# Patient Record
Sex: Female | Born: 1980 | Race: White | Hispanic: No | Marital: Married | Smoking: Never smoker
Health system: Southern US, Community
[De-identification: ages and names within clinical notes are randomized; demographics above are authoritative.]

## PROBLEM LIST (undated history)

## (undated) DIAGNOSIS — Z5189 Encounter for other specified aftercare: Secondary | ICD-10-CM

## (undated) DIAGNOSIS — IMO0001 Reserved for inherently not codable concepts without codable children: Secondary | ICD-10-CM

## (undated) DIAGNOSIS — K219 Gastro-esophageal reflux disease without esophagitis: Secondary | ICD-10-CM

## (undated) DIAGNOSIS — M797 Fibromyalgia: Secondary | ICD-10-CM

## (undated) DIAGNOSIS — F419 Anxiety disorder, unspecified: Secondary | ICD-10-CM

## (undated) DIAGNOSIS — F32A Depression, unspecified: Secondary | ICD-10-CM

## (undated) DIAGNOSIS — F329 Major depressive disorder, single episode, unspecified: Secondary | ICD-10-CM

## (undated) DIAGNOSIS — E039 Hypothyroidism, unspecified: Secondary | ICD-10-CM

## (undated) DIAGNOSIS — D649 Anemia, unspecified: Secondary | ICD-10-CM

## (undated) DIAGNOSIS — R51 Headache: Secondary | ICD-10-CM

## (undated) DIAGNOSIS — E282 Polycystic ovarian syndrome: Secondary | ICD-10-CM

## (undated) HISTORY — PX: ADENOIDECTOMY: SHX5191

---
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
## 1999-03-28 ENCOUNTER — Other Ambulatory Visit: Admission: RE | Admit: 1999-03-28 | Discharge: 1999-03-28 | Payer: Self-pay | Admitting: Obstetrics and Gynecology

## 1999-11-22 ENCOUNTER — Encounter: Payer: Self-pay | Admitting: Pediatrics

## 1999-11-22 ENCOUNTER — Inpatient Hospital Stay (HOSPITAL_COMMUNITY): Admission: AD | Admit: 1999-11-22 | Discharge: 1999-11-26 | Payer: Self-pay | Admitting: Pediatrics

## 1999-12-13 ENCOUNTER — Emergency Department (HOSPITAL_COMMUNITY): Admission: EM | Admit: 1999-12-13 | Discharge: 1999-12-13 | Payer: Self-pay | Admitting: Emergency Medicine

## 1999-12-17 ENCOUNTER — Inpatient Hospital Stay (HOSPITAL_COMMUNITY): Admission: EM | Admit: 1999-12-17 | Discharge: 1999-12-20 | Payer: Self-pay | Admitting: Emergency Medicine

## 1999-12-19 ENCOUNTER — Encounter: Payer: Self-pay | Admitting: Pediatrics

## 2000-01-24 ENCOUNTER — Emergency Department (HOSPITAL_COMMUNITY): Admission: EM | Admit: 2000-01-24 | Discharge: 2000-01-24 | Payer: Self-pay | Admitting: Emergency Medicine

## 2000-09-23 ENCOUNTER — Other Ambulatory Visit: Admission: RE | Admit: 2000-09-23 | Discharge: 2000-09-23 | Payer: Self-pay | Admitting: Obstetrics and Gynecology

## 2000-10-09 ENCOUNTER — Encounter: Payer: Self-pay | Admitting: Emergency Medicine

## 2000-10-09 ENCOUNTER — Emergency Department (HOSPITAL_COMMUNITY): Admission: EM | Admit: 2000-10-09 | Discharge: 2000-10-09 | Payer: Self-pay | Admitting: Emergency Medicine

## 2001-07-11 ENCOUNTER — Emergency Department (HOSPITAL_COMMUNITY): Admission: EM | Admit: 2001-07-11 | Discharge: 2001-07-11 | Payer: Self-pay | Admitting: Emergency Medicine

## 2001-07-11 ENCOUNTER — Encounter: Payer: Self-pay | Admitting: Emergency Medicine

## 2001-12-05 ENCOUNTER — Encounter: Payer: Self-pay | Admitting: Emergency Medicine

## 2001-12-05 ENCOUNTER — Emergency Department (HOSPITAL_COMMUNITY): Admission: EM | Admit: 2001-12-05 | Discharge: 2001-12-06 | Payer: Self-pay | Admitting: Emergency Medicine

## 2003-01-07 ENCOUNTER — Emergency Department (HOSPITAL_COMMUNITY): Admission: EM | Admit: 2003-01-07 | Discharge: 2003-01-07 | Payer: Self-pay | Admitting: Emergency Medicine

## 2004-05-04 ENCOUNTER — Ambulatory Visit (HOSPITAL_COMMUNITY): Admission: RE | Admit: 2004-05-04 | Discharge: 2004-05-04 | Payer: Self-pay | Admitting: General Surgery

## 2004-05-09 ENCOUNTER — Ambulatory Visit (HOSPITAL_COMMUNITY): Admission: RE | Admit: 2004-05-09 | Discharge: 2004-05-09 | Payer: Self-pay | Admitting: General Surgery

## 2004-05-22 ENCOUNTER — Encounter: Admission: RE | Admit: 2004-05-22 | Discharge: 2004-08-20 | Payer: Self-pay | Admitting: General Surgery

## 2004-07-31 ENCOUNTER — Inpatient Hospital Stay (HOSPITAL_COMMUNITY): Admission: RE | Admit: 2004-07-31 | Discharge: 2004-08-02 | Payer: Self-pay | Admitting: General Surgery

## 2004-09-18 ENCOUNTER — Encounter: Admission: RE | Admit: 2004-09-18 | Discharge: 2004-09-18 | Payer: Self-pay | Admitting: General Surgery

## 2006-11-22 ENCOUNTER — Ambulatory Visit: Payer: Self-pay | Admitting: Internal Medicine

## 2006-11-22 LAB — CONVERTED CEMR LAB: Sed Rate: 21 mm/hr (ref 0–25)

## 2006-11-30 ENCOUNTER — Ambulatory Visit: Payer: Self-pay | Admitting: Internal Medicine

## 2007-01-30 ENCOUNTER — Ambulatory Visit: Payer: Self-pay | Admitting: Internal Medicine

## 2007-02-18 ENCOUNTER — Ambulatory Visit: Payer: Self-pay | Admitting: Internal Medicine

## 2007-02-22 ENCOUNTER — Emergency Department (HOSPITAL_COMMUNITY): Admission: EM | Admit: 2007-02-22 | Discharge: 2007-02-22 | Payer: Self-pay | Admitting: Emergency Medicine

## 2007-02-27 ENCOUNTER — Ambulatory Visit: Payer: Self-pay | Admitting: Internal Medicine

## 2007-02-27 ENCOUNTER — Ambulatory Visit (HOSPITAL_COMMUNITY): Admission: RE | Admit: 2007-02-27 | Discharge: 2007-02-27 | Payer: Self-pay | Admitting: Internal Medicine

## 2007-03-13 ENCOUNTER — Ambulatory Visit: Payer: Self-pay | Admitting: Internal Medicine

## 2007-03-19 ENCOUNTER — Emergency Department (HOSPITAL_COMMUNITY): Admission: EM | Admit: 2007-03-19 | Discharge: 2007-03-20 | Payer: Self-pay | Admitting: Emergency Medicine

## 2007-04-22 ENCOUNTER — Ambulatory Visit: Payer: Self-pay | Admitting: Internal Medicine

## 2007-04-22 ENCOUNTER — Ambulatory Visit (HOSPITAL_COMMUNITY): Admission: RE | Admit: 2007-04-22 | Discharge: 2007-04-22 | Payer: Self-pay | Admitting: Internal Medicine

## 2007-10-19 ENCOUNTER — Inpatient Hospital Stay (HOSPITAL_COMMUNITY): Admission: EM | Admit: 2007-10-19 | Discharge: 2007-10-22 | Payer: Self-pay | Admitting: Emergency Medicine

## 2007-10-19 ENCOUNTER — Ambulatory Visit: Payer: Self-pay | Admitting: Internal Medicine

## 2007-11-22 ENCOUNTER — Emergency Department (HOSPITAL_COMMUNITY): Admission: EM | Admit: 2007-11-22 | Discharge: 2007-11-23 | Payer: Self-pay | Admitting: Emergency Medicine

## 2007-12-09 ENCOUNTER — Inpatient Hospital Stay (HOSPITAL_COMMUNITY): Admission: EM | Admit: 2007-12-09 | Discharge: 2007-12-10 | Payer: Self-pay | Admitting: Emergency Medicine

## 2008-05-07 ENCOUNTER — Emergency Department (HOSPITAL_BASED_OUTPATIENT_CLINIC_OR_DEPARTMENT_OTHER): Admission: EM | Admit: 2008-05-07 | Discharge: 2008-05-07 | Payer: Self-pay | Admitting: Emergency Medicine

## 2008-05-07 ENCOUNTER — Emergency Department (HOSPITAL_COMMUNITY): Admission: EM | Admit: 2008-05-07 | Discharge: 2008-05-07 | Payer: Self-pay | Admitting: Emergency Medicine

## 2009-03-05 ENCOUNTER — Emergency Department (HOSPITAL_BASED_OUTPATIENT_CLINIC_OR_DEPARTMENT_OTHER): Admission: EM | Admit: 2009-03-05 | Discharge: 2009-03-05 | Payer: Self-pay | Admitting: Emergency Medicine

## 2009-03-05 ENCOUNTER — Ambulatory Visit: Payer: Self-pay | Admitting: Diagnostic Radiology

## 2009-09-27 ENCOUNTER — Encounter: Admission: RE | Admit: 2009-09-27 | Discharge: 2009-09-27 | Payer: Self-pay | Admitting: Obstetrics & Gynecology

## 2011-02-06 LAB — BASIC METABOLIC PANEL
Chloride: 104 mEq/L (ref 96–112)
Creatinine, Ser: 0.7 mg/dL (ref 0.4–1.2)
Sodium: 144 mEq/L (ref 135–145)

## 2011-02-06 LAB — PREGNANCY, URINE

## 2011-03-13 NOTE — Discharge Summary (Signed)
NAMEKSENIYA, GRUNDEN             ACCOUNT NO.:  [id number]   MEDICAL RECORD NO.:  [id number]          PATIENT TYPE:  INP   LOCATION:  1508                         FACILITY:  [facility]   PHYSICIAN:  Mobolaji B. Bakare, M.D.DATE OF BIRTH:  06/05/1981   DATE OF ADMISSION:  10/19/2007  DATE OF DISCHARGE:                               DISCHARGE SUMMARY   PRIMARY CARE PHYSICIAN:  Brandt Loosen, M.D. at [facility].   CARDIOLOGIST:  Doylene Canning. Ladona Ridgel, M.D.   FINAL DIAGNOSES:  1. Delirium secondary to hyperthyroidism.  2. 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  3. History of hypothyroidism, on Synthroid prior to hospitalization.  4. Sinus tachycardia during this hospitalization.  The patient has a      history of inappropriate tachycardia.  5. Bipolar disorder.  6. Anxiety disorder.   PROCEDURES:  MRI of the brain done on October 22, 2007 showed no acute  abnormality.  It was essentially normal.  Head CT scan done on October 19, 2007 showed no active intracranial abnormality. Chest x-ray done on  October 19, 2007 showed no acute cardiopulmonary disease.   CONSULTATIONS:  Psychiatric consult provided by Dr. Jeanie Sewer.   BRIEF HISTORY:  Ms. [name] is a 30 year old Caucasian female who  presented with acute onset of headache and visual of hallucinations.  There was no fever or chills.  She had no symptoms to suggest infection.  Her chest x-ray was normal.  Urinalysis was normal.  She does have a  history of bipolar disorder.  She gives a history of recent increase in  her medications, including alprazolam and Synthroid.  The patient was  admitted with visual hallucinations and headache for further evaluation.   Delirium. There was no cause of infection to explain the delirium  picture as mention. The patient was noted to have further symptoms  including sweaty palms, tachycardia and palpitations.  TSH, free T4 and  T3 were checked.  These were clearly abnormal with a TSH of  0.206, free  T4 of 1.81 (normal range of 0.89 to 1.80) and a T3 of 204 (normal range  80 to 204).  Synthroid was temporarily held.  She was seen in  consultation by Dr. Jeanie Sewer, who concurred that the delirium is  secondary to hyperthyroidism.  She was started on Zyprexa and her  symptoms resolved.  Additionally, Synthroid has been held temporarily  until she sees her primary care physician who will restart it.  Ultimately, she needs a step-down of her Synthroid dose.   Acute bronchitis.  The patient has a cough productive of sputum.  There  was no fever or leukocytosis.  She had cultures x1, which were no  growth.  Rapid strep  was negative.  She was treated symtomatically.   Sinus tachycardia.  Heart rate were in the range of  100 through 147,  Coreg was continued on admission. Coreg was increased at the time of  discharge for better heart rate control . She should follow up with her  primary care physician and Dr. Lewayne Bunting in 1-2 weeks.  It is noted  that the patient has history of  sinus tachycardia .   Bipolar disorder.  This was stable during the current hospitalization.   DISCHARGE MEDICATIONS:  1. Coreg 12.5 mg B.I.D.  2. Zyprexa 2.5 mg every evening.  3. Ativan 2 mg t.i.d.  4. __________ p.r.n.  5. Levothyroxine is on hold.   DISCHARGE CONDITION:  Stable.   FOLLOWUP:  Follow up with Dr. Lewayne Bunting. Marland Kitchen   DISCHARGE VITALS:  Temperature 98.1, pulse 114, respiratory rate 18,  blood pressure 115/85, O2 saturations of 96%      Mobolaji B. Corky Downs, M.D.  Electronically Signed     MBB/MEDQ  D:  10/22/2007  T:  10/22/2007  Job:  [id number]   cc:   Doylene Canning. Ladona Ridgel, MD  [address]  [address]  [place]  [place] [address]   Brandt Loosen, M.D.  [place]

## 2011-03-13 NOTE — H&P (Signed)
[name], [name]             ACCOUNT NO.:  [id number]   MEDICAL RECORD NO.:  [id number]          PATIENT TYPE:  INP   LOCATION:  0102                         FACILITY:  [facility]   PHYSICIAN:  Darryl D. Prime, MD    DATE OF BIRTH:  [birth year]   DATE OF ADMISSION:  12/09/2007  DATE OF DISCHARGE:                              HISTORY & PHYSICAL   PRIMARY CARE PHYSICIAN:  The patient was unable to give current history  due to heavy sedation and she was the only one present in the room.   CHIEF COMPLAINT:  She could not give due to being heavily sedated,  somnolent.   TOTAL VISIT TIME:  Approximately 30 minutes.   HISTORY OF PRESENT ILLNESS:  [name] is a 30 year old female  with a history apparently of bipolar disorder, a brain tumor,  hypothyroidism, who took 49 of her 2-mm p.o. Ativan sometime tonight, at  least.  The patient is unsure of the exact details.  The patient in the  emergency room was given IV fluids and her vital signs were checked,  which were stable and have been stable.  She is significantly drowsy and  I am unable to arouse.   PAST MEDICAL HISTORY/PAST SURGICAL HISTORY:  History as above.  Also a  history of status post cholecystectomy, history of possible  hypertension.   MEDICATIONS:  1. Synthroid 175 mcg daily.  2. Carvedilol 6.25 mg twice a day.  3. Ativan 2 mg p.o. t.i.d.   ALLERGIES:  She is allergic to BETADINE.   PHYSICAL EXAM:  Blood pressure 117/74, pulse of 90, respiratory rate of  16, temperature 97.6.  Saturations are 98% on room air.  GENERAL:  Her pupils equal, round, and reactive to light.  She is  heavily sedated and responds only to heavy noxious stimuli.  She has her  eyes closed .She is pale, chronically ill-appearing, obese.  The oropharynx appears dry.  NECK:  Supple with no lymphadenopathy or thyromegaly.  LUNGS:  Clear to auscultation bilaterally.  CARDIOVASCULAR:  Regular rate and rhythm with no murmurs, rubs or  gallops.  Normal S1, S2.  No S3 or S4.  ABDOMEN:  Obese, soft, nontender, nondistended, with no  hepatosplenomegaly.  EXTREMITIES:  There is no clubbing or cyanosis.  There is trace lower  extremity edema.  Foley is draining clear urine.   The patient's EKG showed normal sinus rhythm with a rate of 87, normal  axis, normal intervals, with a QT corrected at 459.  Otherwise negative.   LABORATORY DATA:  Sodium of 140, potassium 3.4, chloride 106, bicarb 29,  BUN 5, creatinine 0.74, glucose of 101.  Alcohol less than 5.  Acetaminophen less than 10.  UDS positive for benzodiazepines and  opiates.  White blood cell count is 8.6, hemoglobin 12.2, hematocrit 36,  platelets 404, with segs of 64%.  Her salicylates is less than 4.  Chest  x-ray is pending.   ASSESSMENT AND PLAN:  This is a patient with a history of bipolar  disorder.  I am unsure of the details of her possible overdose, which  she  is significantly sedated.  She will be admitted to the ICU under  close observation for her vital signs and neurologic status.  When she  does wake up, I suggest she may need a psychiatry consult to rule out  suicidal attempt.  We will check a TSH and prophylaxis will be with  enoxaparin and proton pump inhibitors for DVT prophylaxis and GI  prophylaxis.      Darryl D. Prime, MD  Electronically Signed     DDP/MEDQ  D:  12/09/2007  T:  12/09/2007  Job:  [id number]

## 2011-03-13 NOTE — Consult Note (Signed)
[name], [name]             ACCOUNT NO.:  [id number]   MEDICAL RECORD NO.:  [id number]          PATIENT TYPE:  INP   LOCATION:  1508                         FACILITY:  [facility]   PHYSICIAN:  Antonietta Breach, M.D.  DATE OF BIRTH:  1981-10-15   DATE OF CONSULTATION:  10/19/2007  DATE OF DISCHARGE:  10/22/2007                                 CONSULTATION   REASON FOR CONSULTATION:  Psychosis.   HISTORY:  [name] is a 30 year old female admitted to the [facility] on December 21 with visual hallucinations.  The patient  developed approximately one week of progressive visual hallucinations  along with a headache. She has been having confusion.   PAST PSYCHIATRIC HISTORY:  The patient has, in the medical record,  listed question of bipolar disorder, and also listed a history of  anxiety and depression.   MENTAL STATUS EXAM:  As above.  The patient continues with the delirium  symptoms.   ASSESSMENT:  293.00, delirium, not otherwise specified.  This may be  secondary to hyperthyroidism.   I concur with antipsychotic medication, Zyprexa 5 mg daily.      Antonietta Breach, M.D.  Electronically Signed     JW/MEDQ  D:  03/10/2008  T:  03/10/2008  Job:  [id number]

## 2011-03-13 NOTE — H&P (Signed)
[name], [name]             ACCOUNT NO.:  [id number]   MEDICAL RECORD NO.:  [id number]          PATIENT TYPE:  INP   LOCATION:  1401                         FACILITY:  [facility]   PHYSICIAN:  Gardiner Barefoot, MD    DATE OF BIRTH:  November 11, 1980   DATE OF ADMISSION:  10/19/2007  DATE OF DISCHARGE:                              HISTORY & PHYSICAL   PRIMARY CARE PHYSICIAN:  Gentry Fitz, is in [place].   CHIEF COMPLAINT:  Headache and visual hallucinations.   HISTORY OF PRESENT ILLNESS:  This is a 30 year old female with multiple  medical problems, including history of migraines with aura; gastric  bypass, anxiety and a report of bipolar disorder, hypothyroidism.  Presents here with 2 weeks of progressively worsening headache,  described as frontal and going around to her occipital region;  predominately felt to be due to increased stress.  She reports she has  also had some right-sided migraines that are typical and continue to be  unrelieved by traditional migraine medications.  Of note, she does  report that she takes Vicodin and rest, which does alleviate her  migraines.  She reports that she has recently increased her thyroid  medications, as well as changed from alprazolam to lorazepam with  different doses (from 0.5 mg of alprazolam 3 times a day to 2 mg and  Ativan 3 times a day).  She also has had 2 increases in her  levothyroxine from 137 to 150 to 175 over the period of the last 2  months.  She also continues to see her cardiologist here at [facility], and  endocrinology for her thyroid disease.  The patient otherwise reports no  recent fevers.   PAST MEDICAL HISTORY:  1. History of gastric bypass.  2. Hypothyroidism.  3. Anxiety.  4. Depression.  5. Question of bipolar disorder.  6. Polycystic ovarian disease, as listed.  7. Appendectomy.  8. Cholecystectomy.  9. Tonsillectomy.  10.Inappropriate tachycardia.   MEDICATIONS:  1. Ativan 2 mg p.o. t.i.d.  2.  Levothyroxine 175 mcg p.o. daily.  3. Vitamin B12.  4. Calcium chloride.  5. Multivitamin.  6. Coreg.   ALLERGIES:  PENICILLIN, TOPAMAX, SHELLFISH and SHRIMP.   SOCIAL HISTORY:  Denies any alcohol, tobacco or drugs.   FAMILY HISTORY:  Includes lupus in her mother and vasculitis in her  father.   REVIEW OF SYSTEMS:  Negative, except as per the history of present  illness.   PHYSICAL EXAMINATION:  Temperature 98.2, pulse 128, respirations 20, BP  111/75, O2 saturations 98% on room air.  GENERAL:  The patient is awake,  alert and oriented x3; appears in no acute distress.  CARDIOVASCULAR:  Tachycardic and regular rhythm; with no murmurs, rubs or gallops.  LUNGS:  Clear to auscultation bilaterally.  ABDOMEN:  Obese, nontender, nondistended.  Positive bowel sounds and no  hepatosplenomegaly.  EXTREMITIES:  No cyanosis or edema.   LABORATORY DATA:  EKG was sinus tachycardic.  ASR 16, AST 16, ALT 12,  sodium 139, potassium 3.7, chloride 101, bicarb 27, BUN 11, creatinine  1.20, glucose 114.  Alcohol less than 5.  WBC  7.5, hemoglobin 13,  platelets 388.  Urine pregnancy negative.   IMPRESSION AND PLAN:  1. Visual Hallucinations.  This may be medication-induced effect,      either increasing her benzodiazepines or changes to her thyroid      medications.  Will hold dose of her Ativan and change to a b.i.d.      regimen; as well as check her thyroid studies with a TSH and Free      T4.  If these not revealing, or if she continues to have visual      hallucinations will look for other etiology.  Also, will do an MRI      at this time.  She has had an abnormal MRI finding in the past and      has not been followed up since.  2. Anxiety.  As above, will hold her Ativan one dose and decrease      after that.  3. Hypothyroidism.  Will hold her dose at this time and wait for the      thyroid studies then restart as appropriate.  4. History of gastric bypass.  Will continue the patient's  vitamins.      Gardiner Barefoot, MD  Electronically Signed     RWC/MEDQ  D:  10/19/2007  T:  10/20/2007  Job:  [id number]

## 2011-03-13 NOTE — Assessment & Plan Note (Signed)
[facility]                         ELECTROPHYSIOLOGY OFFICE NOTE   [name], [name]                    MRN:          [id number]  DATE:03/13/2007                            DOB:          1981-06-26    [name] returns today for followup.  She is a very pleasant 30-year-  old young lady with a history of tachy palpitations and documented heart  rates in the 120-140 range.  She underwent head up tilt-table testing  where she had no inducible syncope.  The patient ultimately underwent EP  study and there was no inducible SVT.  No accessory pathway conduction,  no dual AV nodal physiology.  She was placed on Toprol XL and returns  today for followup.  Her history is notable in that she is status post  stomach surgery to help with weight loss.  She denies any medical  noncompliance.  She knows that she gets fatigued very easily and is  tired all the time.  She does admit to having a very stressful life  style and works up to 14 hours a day.  She is on her feet all the time.  She has dyspnea on exertion.  She has preserved LV function by 2D echo.  She has no history of lung problems.   MEDICATIONS:  1. Toprol XL 100 daily.  2. Synthroid.  3. Multivitamin.  4. Ovcon.   PHYSICAL EXAMINATION:  GENERAL:  She is a pleasant, well-appearing young  woman in no distress.  VITAL SIGNS:  Blood pressure was 108/62, the pulse was 115, and regular,  respirations were 18, the weight was 180 pounds.  NECK:  Revealed no jugular venous distension.  LUNGS:  Clear bilaterally to auscultation and no wheezes, rales or  rhonchi and I did not appreciate any difficulty with breathing.  CARDIOVASCULAR EXAM:  Revealed a regular tachycardia with normal S1 and  S2.  EXTREMITIES:  Demonstrated no edema.   EKG demonstrates sinus tachycardia.   IMPRESSION:  1. Inappropriate sinus tachycardia.  2. Fatigue and dyspnea.  3. History of obesity with gastric bypass  surgery.   DISCUSSION:  [name] symptoms today are interesting in that her  heart rate is still high despite fairly high dose Toprol.  With her  prior history of stomach surgery, I wonder if in fact she is not  absorbing her beta blocker secondary to the coated portion of the beta  blocker and slow release form of it. With regard to her dyspnea, I had  her walk on pulse oximetry three steps one time today.  Each time her  heart rate would start in the 115-120 range and would go up to a maximum  of 140 beats per minute.  During exercise, particularly at the end of  exercise, her oxygen saturation would drop from the low 90's into the  low 80's and would be sustained in the low 80's.  I checked the pulse  oximetry monitor and it appeared to be working satisfactorily.  The  patient noted fatigue and weakness with this.   IMPRESSION:  1. Inappropriate sinus tachycardia.  2. Symptomatic fatigue and  dyspnea.  3. Palpitations secondary to #1.   With the above findings I have asked the patient to first and foremost  change her Toprol to Metoprolol 50 twice a day.  I have also asked that  she undergo cardiopulmonary stress-testing so we might better understand  why her oxygen saturation is dropping with exertion. There is no history  of occult lung disease.  Additional recommendations will be based on the  results of her cardiopulmonary stress-test.     Doylene Canning. Ladona Ridgel, MD  Electronically Signed    GWT/MedQ  DD: 03/13/2007  DT: 03/13/2007  Job #: [id number]   cc:   Bevelyn Buckles. Bensimhon, MD

## 2011-03-13 NOTE — Op Note (Signed)
NAMEPHEBE, DETTMER             ACCOUNT NO.:  [id number]   MEDICAL RECORD NO.:  [id number]          PATIENT TYPE:  OIB   LOCATION:  2899                         FACILITY:  [facility]   PHYSICIAN:  Doylene Canning. Ladona Ridgel, MD    DATE OF BIRTH:  1980-11-29   DATE OF PROCEDURE:  02/27/2007  DATE OF DISCHARGE:                               OPERATIVE REPORT   PROCEDURE PERFORMED:  Head-up tilt table testing using isoproterenol  followed by electrophysiologic testing also using isoproterenol.   INTRODUCTION:  The patient is a 30 year old woman with a history of  tachy palpitations for many months.  The patient has been very active  and had several jobs.  We have tried her on medical therapy with beta  blockers, but these have not helped in any appreciable way to control  her palpitations.  She is now referred for head-up tilt table testing as  she has had a history of syncope and the question is whether or not she  may have postural orthostatic tachycardia syndrome or whether or not she  has evidence of neurally mediated syncope.  Prior cardiac monitoring  suggested a very rapid sinus tachycardia through an atrial tachycardia  or sinus node reentry could not be excluded.   PROCEDURE:  After informed consent was obtained, the patient was taken  to the diagnostic EP lab in a fasting state.  After the usual  preparation, she was placed in the supine position and underwent head-up  tilt table testing.  Her initial blood pressure was 140/90 and her heart  rate was 90.  She was placed in the 70 degree head up tilt table  position and her heart rate which was initially in the low 90s increased  over the next 30 minutes up to 122 beats per minute.  It increased  further up to a high of 130 beats per minute.  During this time her  blood pressure remained fairly stable in the 120 to 130 range.  She felt  hot, but she did not pass out.  She was placed back in the supine  position and isoproterenol was  started 7.5 mics per minute and then  increased up to 15 mics per minute wherein her heart rate increased to  115 beats per minute supine with a blood pressure in the 130s.  She was  turned back up into the 70 degree head up tilt table position and her  heart rate increased from the mid 130s up to 180 beats per minute.  Her  blood pressure during this time did not change.  She did not have any  frank syncope.  She subsequently now will be referred for invasive  electrophysiologic study.   After the patient was prepped and draped in the usual manner, she was  given additional fentanyl and Versed for sedation.  It should be noted  that very large amounts of sedation were given without much effect on  the patient.  Specifically she was given in less than one hours time  over 30 mg of Valium, 10 mg of Versed and approximately 150 mics of  fentanyl and  had no significant sedative effects on her.  A 6-French  hexapolar catheter was inserted percutaneously into the right jugular  vein and advanced to the coronary sinus.  A 5-French quadripolar  catheter was inserted percutaneously in the right femoral vein and  advanced to the RV apex.  A 5-French quadripolar catheter was inserted  percutaneously in the right femoral vein and advanced to the His bundle  region.  After measurement of the basic intervals, rapid ventricular  pacing was carried out from the RV apex and stepwise decreased down 270  milliseconds where VA conduction remained intact.  During rapid  ventricular pacing, the atrial activation was midline and decremental.  Next, programmed ventricular stimulation was carried out from the RV  apex at a base drive cycle length of 540 milliseconds.  The S1, S2  interval was stepwise decreased down to 210 milliseconds where the  retrograde AV node ERP was observed.  During programmed ventricular  stimulation, the atrial activation was again midline and decremental.  Next, programmed atrial  stimulation was carried out from the coronary  sinus at a base drive cycle length of 981 milliseconds.  The S1, S2  interval was stepwise decreased down to 210 milliseconds where the AV  node ERP was observed.  During programmed atrial stimulation, there no  AH jumps and no echo beats and no inducible SVT.  Next, rapid atrial  pacing was carried out from the coronary sinus in the high right atrium  with paced cycle lengths of 500 milliseconds and stepwise decreased down  to 240 milliseconds where AV Wenckebach was observed.  During rapid  atrial pacing, the PR interval was equal to but not greater than the RR  interval and there was no inducible SVT.  At this point isoproterenol  was infused at rates of up to 4 mics per minute.  On 1 mic of  isoproterenol the patient had a resting heart rate of 130 beats per  minute.  Additional rapid atrial pacing as well as programmed atrial  stimulation was carried out utilizing S1, S2 and S1, S2, S3 stimuli, but  despite this very aggressive protocol of pacing and stimulation, there  was no inducible SVT.  At this point the catheters were removed.  Hemostasis was assured, and the patient was returned to her room in  satisfactory condition.   COMPLICATIONS:  There were no procedure complications.   RESULTS:  A.  Baseline electrocardiogram.  The baseline ECG demonstrates  sinus rhythm with normal axis and intervals.  B.  Baseline intervals.  The HV interval was 39 milliseconds.  The QRS  duration was 94 milliseconds.  C.  Rapid ventricular pacing.  Rapid ventricular pacing was carried out  from the RV apex demonstrating VA Wenckebach less than or equal to 270  milliseconds.  During rapid ventricular pacing, the atrial activation  was midline and decremental.  D.  Programmed ventricular stimulation.  Programmed ventricular  stimulation was carried out from the RV apex at a base drive cycle length of 191 milliseconds.  The S1, S2 interval was stepwise  decreased  down to 210 milliseconds where the retrograde AV node ERP was observed.  During programmed ventricular stimulation, the atrial activation was  again midline and decremental.  E.  Rapid atrial pacing.  Rapid atrial pacing was carried out from the  coronary sinus in the high right atrium and stepwise decreased down to  240 milliseconds where AV Wenckebach was observed.  On isoproterenol,  right atrial pacing was carried  out down to 210 milliseconds where AV  Wenckebach was observed.  F.  Programmed atrial stimulation.  Programmed atrial stimulation was  carried out from the high right atrium to the coronary sinus at base  drive cycle length of 161 milliseconds.  The S1, S2 interval was  stepwise decreased down to 210 milliseconds where AV node ERP was  observed.  During programmed atrial stimulation, there no AH jumps and  no echo beats.   CONCLUSION:  This study demonstrates first a head-up tilt table test  which demonstrated no evidence of inducible syncope.  There was a very  marked and dramatic rise in the patient's heart rate on fairly low doses  of isoproterenol raising the question of an exquisitely sensitive sinus  node.  The EP study demonstrates no inducible VT or SVT and no evidence  of any accessory pathway conduction, any evidence of a slow pathway or  any evidence of inducible atrial tachycardia.  In summary, it appears  that the patient has inappropriate sinus tachycardia with a very rapidly  conduction AV node.      Doylene Canning. Ladona Ridgel, MD  Electronically Signed     GWT/MEDQ  D:  02/27/2007  T:  02/27/2007  Job:  [id number]   cc:   Dr. Brandt Loosen

## 2011-03-13 NOTE — Discharge Summary (Signed)
[name], [name]             ACCOUNT NO.:  [id number]   MEDICAL RECORD NO.:  [id number]          PATIENT TYPE:  OIB   LOCATION:  2899                         FACILITY:  [facility]   PHYSICIAN:  Carolanne Grumbling, M.D.    DATE OF BIRTH:  1981-03-29   DATE OF ADMISSION:  02/27/2007  DATE OF DISCHARGE:  02/27/2007                               DISCHARGE SUMMARY   PRINCIPAL DIAGNOSES:  1. History of tachy palpitations and dizzy spells for many months      prior to this admission.  2. Tilt table study, Feb 27, 2007, no syncope and no inducible syncope      with Isuprel.  3. Electrophysiology study, Feb 27, 2007, no inducible ventricular      tachycardia/supraventricular tachycardia.  No accessory pathway was      aluminated.  No AV nodal reentry tachycardia.  No atrial      tachycardia.  Her AV conduction is very fast noted in the EP lab.  4. Both studies were done to elucidate: Symptomatic      (lightheaded/presyncopal) tachy palpitations.      a.     Monitor showing ST versus AT.      b.     Patient on beta-blocker therapy, which does not abate her       tachy palpitations effectively.   SECONDARY DIAGNOSES:  1. Bipolar disorder.  2. History of migraines.  3. Status post bariatric surgery.   Patient discharged after the above studies.  She is having some pain in  the right neck and the right groin where the catheters were placed.   1. She will be treated with Vicodin 5/500 one to two tabs every 4-6      hours as needed for pain for this.  2. She also has treated hypothyroidism.  She is on levothyroxine 125      mcg daily.  3. Multivitamin daily.  4. She is to continue Toprol-XL 100 mg daily.   Patient to also follow up with Dr. Ladona Ridgel, Monday, May 19 at 11:30 and  she will be given a prescription Fioricet migraine control.   Laboratory studies pertinent to this admission were taken May 1, white  cell 7.6, hemoglobin 13.4, hematocrit 39.9, platelets 356.  Protime  12.7, INR 0.9.   Sodium is 139, potassium 3.7, chloride 107, carbonate  25, glucose 86, BUN is 5, creatinine 0.76.      Maple Mirza, [place]      Carolanne Grumbling, M.D.  Electronically Signed    GM/MEDQ  D:  02/27/2007  T:  02/27/2007  Job:  [id number]   cc:   Bevelyn Buckles. Bensimhon, MD

## 2011-03-13 NOTE — Consult Note (Signed)
[name]             ACCOUNT NO.:  [id number]   MEDICAL RECORD NO.:  [id number]          PATIENT TYPE:  INP   LOCATION:  1521                         FACILITY:  [facility]   PHYSICIAN:  Antonietta Breach, M.D.  DATE OF BIRTH:  12/27/1980   DATE OF CONSULTATION:  12/10/2007  DATE OF DISCHARGE:                                 CONSULTATION   REFERRING PHYSICIAN:  [facility].   REASON FOR CONSULTATION:  Overdose.   HISTORY OF PRESENT ILLNESS:  [name] is a 30 year old female  admitted to [facility] after an overdose on Ativan.  There may  have been some other substances as well.   The patient had just filled her Ativan bottle which has been prescribed  by her primary care physician.  She is not followed by a psychiatrist.  On obtaining the history, the fiance participated in the session with eh  patient's request and permission.  He confirmed that the patient did  drink beer prior to taking Ativan. At some point, she had taken the  Ativan with the beer and then no longer could remember anything until  she woke up in the [facility]. She denies drinking more than  1/2 of beer; however, she clearly states that she does not recall any  suicidal thoughts, nor does she recall any intention to harm herself.  She states that her mood, interests, constructive future goals have all  been intact.  She does not have any thoughts of harming herself or  others.  She has no hallucinations or delusions.   She has been receiving Ativan from her primary care physician for  excessive worry, feeling on edge, and insomnia as well as muscle  tension.   The patient is now oriented to all spheres.  Her memory function is now  intact.  She is socially appropriate and cooperative. She has  constructive future goals.  She denies any illegal drugs.  Initially she  denied alcohol and then acknowledged very minimal use.  She does not  give a history of dependence.   PAST  PSYCHIATRIC HISTORY:  There is no prior history of self harm or  suicide attempts.  She has no history of mania.  She has never been on  psychotropic medications other than Ativan prescribed by her primary  care physician.   She has never been seen by a psychiatrist nor has she had any  psychiatric admissions.  She has had counseling for coping skills and  stress management.   FAMILY PSYCHIATRIC HISTORY:  None known.   SOCIAL HISTORY:  The patient is living with her parents which she states  does result in increased anxiety.  She has a loving supportive fiance  who wants to help her in her health care.   PAST MEDICAL HISTORY:  Status post benzodiazepine and alcohol overdose.   MEDICATIONS:  Reviewed.   REVIEW OF SYSTEMS:  Noncontributory.   MENTAL STATUS EXAM:  [name] is alert.  She is oriented to all  spheres.  Her attention span is within normal limits.  Concentration  within normal limits.  Mood within  normal limits.  Affect broad and  appropriate.  She is oriented to all spheres.  Her memory function is  now intact and intact to immediate, recent, and remote except for the  overdose blackout period.  Fund of knowledge and intelligence within  normal limits.  Speech within normal limits.  Though process: Logical,  coherent, goal directed.  No looseness of associations.  Thought  content:  No thoughts of harming herself, no thoughts of harming others,  no delusions, no hallucinations.  Insight is partial.  Judgment is  intact.   The patient is motivated for alcohol and drug services followup.   ASSESSMENT:  AXIS I:  1. Adjustment disorder with mixed disturbance of emotions and conduct,      provisional.  The patient does have a possible history of      generalized anxiety disorder.  2. Rule out polysubstance dependence.  AXIS II:  Deferred.  AXIS III:  See general medical section in the record.  AXIS IV:  Primary support group.  AXIS V:  55.   [name] is no  longer at risk to harm herself or others.  She does  agree to call emergency services immediately for any thoughts of harming  herself, thoughts of harming others, or distress.   Given that the patient did overdose on substances resulting in general  medical admission, the undersigned did recommend admission to an  inpatient dual diagnosis program; however, the patient declined, and she  is not committable now that she has recovered from intoxication.   She is motivated for outpatient treatment.   RECOMMENDATIONS:  1. Would ask the social worker to set this patient up with alcohol and      drug services for counseling as well as her chemical abuse      problems.  2. Regarding anxiety, her anxiety condition is not sever enough to      warrant using psychotropic medication under the circumstances of      substance abuse.  Will defer the possible use of an SSRI, for      example, to the patient's outpatient treatment team.  He type of      anxiety seems to be most amenable to therapy as the #1 first line      treatment approach including cognitive behavioral therapy, deep      breathing, and progressive muscle relaxation.      Antonietta Breach, M.D.  Electronically Signed     JW/MEDQ  D:  12/10/2007  T:  12/10/2007  Job:  [id number]   cc:   [facility]

## 2011-03-13 NOTE — Discharge Summary (Signed)
[name], [name]             ACCOUNT NO.:  [id number]   MEDICAL RECORD NO.:  [id number]          PATIENT TYPE:  OIB   LOCATION:  2899                         FACILITY:  [facility]   PHYSICIAN:  Maple Mirza, PA   DATE OF BIRTH:  1981/05/11   DATE OF ADMISSION:  02/27/2007  DATE OF DISCHARGE:                               DISCHARGE SUMMARY   ADDENDUM:  1. Previous dictation greater than 25 minutes, a discharge dictation.  2. The patient was hampered by migraine headaches.  The first thought      was to prescribe Fioricet.  This has caffeine, and this probably      would exacerbate her tachy palpitations, and so prescription for      Fioricet was withdrawn.      Maple Mirza, PA     GM/MEDQ  D:  02/27/2007  T:  02/27/2007  Job:  [id number]

## 2011-03-13 NOTE — Consult Note (Signed)
[name], [name]             ACCOUNT NO.:  [id number]   MEDICAL RECORD NO.:  [id number]          PATIENT TYPE:  EMS   LOCATION:  MAJO                         FACILITY:  [facility]   PHYSICIAN:  Garrison Columbus. Haithcock, MDDATE OF BIRTH:  [birth year]   DATE OF CONSULTATION:  DATE OF DISCHARGE:  03/20/2007                                 CONSULTATION   HISTORY OF PRESENT ILLNESS:  Ms. [name] presented to the emergency  room because of tachy palpitations.  She has been under the care of Dr.  Lewayne Bunting for the same and has been diagnosed with inappropriate  sinus tachycardia.  She feels that her symptoms have progressed despite  beta blocker therapy.  Besides the progression of her symptoms, they  have not changed in nature since the original conversations with Dr.  Ladona Ridgel during which inappropriate sinus tach had been diagnosed.  The  patient has undergone a tilt-table test as well as a complete workup,  which included thyroid function evaluation - the patient is hypothyroid  and is under the care of a physician for this.  The patient requested a  different medication beside metoprolol today; her mother suggested  Sotalol.  We decided upon Diltiazem.  I wrote the patient a prescription  for Diltiazem 30 mg p.o. q.6 hours and asked that she call Dr. Ladona Ridgel in  the morning to confirm his agreement with this medication change.      Daniel B. Haithcock, MD  Electronically Signed     DBH/MEDQ  D:  03/20/2007  T:  03/20/2007  Job:  [id number]

## 2011-03-16 NOTE — Discharge Summary (Signed)
[name], [name]             ACCOUNT NO.:  [id number]   MEDICAL RECORD NO.:  [id number]          PATIENT TYPE:  INP   LOCATION:  1521                         FACILITY:  [facility]   PHYSICIAN:  Wilson Singer, M.D.DATE OF BIRTH:  [birth year]   DATE OF ADMISSION:  12/09/2007  DATE OF DISCHARGE:  12/10/2007                               DISCHARGE SUMMARY   FINAL DISCHARGE DIAGNOSIS:  Benzodiazepine overdose, not actively  suicidal   CONDITION ON DISCHARGE:  Stable.   HISTORY:  This is a 30 year old lady who was admitted with a  benzodiazepine overdose and has a history of bipolar disorder.  Please  see initial history and physical examination done by Dr. Hannah Beat.   HOSPITAL PROGRESS:  The patient was admitted and was medically stable  fairly quickly in the next day or so.  She was reviewed by Dr.  Jeanie Sewer, psychiatrist, who felt that she had an adjustment disorder  with mixed disturbance of emotions and conduct.  She did not feel that  she needed inpatient therapy for any suicidal ideation.  The patient in  any case declined this that was offered in terms of admission to an  inpatient unit for polysubstance abuse as well.  The patient was able to  be discharged in stable condition on no medication and has agreed to  follow up with a primary care physician.      Wilson Singer, M.D.  Electronically Signed     NCG/MEDQ  D:  02/20/2008  T:  02/20/2008  Job:  [id number]

## 2011-03-16 NOTE — Letter (Signed)
November 22, 2006    Brandt Loosen, M.D.  [address]  [place], [place] [address]   RE:  [name], [name]  MRN:  [id number]  /  DOB:  01-30-1981   Dear Dr. Wende Bushy:   Thank you for referring Ms. [name] for EP evaluation.  As you  know, she is a pleasant 30 year old woman with a history of obesity,  status post bariatric surgery, which has recently developed tachy  palpitations with heart rates of up to 140 beats per minute which occur  without exertion.  She is referred today for evaluation.   PHYSICAL EXAMINATION:  Her exam is for the most part unremarkable except  that she is overweight, but without any obvious complaints.  VITAL SIGNS:  All stable.  The rest of her exam was unremarkable.   I have discussed the treatment options with the patient and her mother,  who is also a patient of yours, and who I have seen as well.  I have  recommended that we have her wear a cardiac monitor and undergo a period  of watchful waiting.  Depending on the results of the monitor, we may  recommend additional medical therapy for this patient.  Once again,  thanks for referring Ms. Barzdins for EP evaluation.    Sincerely,      Doylene Canning. Ladona Ridgel, MD  Electronically Signed   GWT/MedQ  DD: 11/22/2006  DT: 11/23/2006  Job #: [id number]

## 2011-03-16 NOTE — Assessment & Plan Note (Signed)
[facility]                         ELECTROPHYSIOLOGY OFFICE NOTE   [name], [name]                    MRN:          [id number]  DATE:11/22/2006                            DOB:          April 14, 1981    [name] is referred today by Dr. Brandt Loosen from [facility] for  evaluation of palpitations and an irregular heart beat.   HISTORY OF PRESENT ILLNESS:  The patient is a very pleasant 30 year old  woman with morbid obesity who is status post bariatric surgery  approximately 2 years ago. She also carries a diagnosis of bipolar  disorder and has had palpitations off and on for many years. 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 She is status post tonsillectomy and  cholecystectomy in the past as well. She is status post appendectomy.  The patient notes that her palpitations occur suddenly and typically  without warning. She notes that her heart sometimes beats fast, but  usually it is more of a hard sensation or sensation of very hard  irregular heart beats. With this, she becomes diaphoretic and sometimes  dizzy and has chest pressure. Occasionally, she has shortness of breath.  The patient is referred today for additional evaluation. These episodes  may last for as long as 10 to 30 minutes. They start and stop gradually.  There is relationship to exertion. She has never had syncope. Her  additional past medical history is as noted in the HPI. Otherwise, she  has been stable. Her mother is concerned that she may have lupus and has  requested that we obtain sed rate and an ANA.   FAMILY HISTORY:  Notable for a father with vasculitis and a mother with  fibromyalgia and atrial fibrillation.   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 She denies alcohol or tobacco use. She presently  lives with her mother.   MEDICATIONS:   Ovcon.   SHE GIVES HISTORY OF ALLERGIES TO TOPAMAX AND ZOMIG AND PENICILLIN.   REVIEW OF SYSTEMS:  Is as noted in the HPI. She also has some  dysmenorrhea and endometriosis. She has problems with anxiety. She notes  some chronic anemia. She has some difficulty with food intake and gets  full quite easily now that she has undergone bariatric surgery.   FAMILY HISTORY:  As noted above. Father apparently is deceased from his  vasculitis.   The patient is noted to have p.r.n. propranolol available to her.   Her physical exam is notable for a pleasant, well appearing woman in no  acute distress. Blood pressure was 120/74 with a pulse 81 and regular.  The respirations were 18. The weight was 174 pounds.  HEENT:  Was normocephalic, atraumatic. Pupils were equal and round.  Oropharynx was moist. The sclerae were anicteric.  NECK:  Revealed no jugular venous distention. There was no thyromegaly.  Trachea was midline. Carotids were 2+ and symmetric.  The lungs were clear bilaterally to auscultation. No wheezes, rales or  rhonchi. There was no  increased work of breathing.  The cardiovascular exam was distant with regular rate and rhythm with  normal S1 and S2. No murmurs, rubs, or gallops.  ABDOMINAL EXAM:  Was obese, nontender, nondistended. There was no  organomegaly. The bowel sounds were present. There was no rebound or  guarding.  EXTREMITIES:  Demonstrated no clubbing, cyanosis, or edema. The pulses  were 2+ and symmetric.  The neurological exam was alert and oriented x3 with cranial nerves  intact. Strength was 5/5 and symmetric.   EKG demonstrates sinus rhythm with normal axis and intervals.   IMPRESSION:  1. Palpitations.  2. History of morbid obesity status post bariatric surgery.  3. History of migraine headaches.   DISCUSSION:  The etiology of the patient's symptoms was unclear.  Basically, there are three mechanisms. One would be inappropriate sinus  tachycardia with an  overactive sinus node cycle length. She could have  episodes of atrial tachycardia like her mother had, or thirdly she could  have SVT. I have discussed these with the patient and her mother in  detail. I have recommended a period of watchful waiting. She can  continue on her beta blocker. If her palpitations worsen or recur, then  having her wear a 30-day cardiac monitor would be warranted.     Doylene Canning. Ladona Ridgel, MD  Electronically Signed    GWT/MedQ  DD: 11/22/2006  DT: 11/23/2006  Job #: [id number]   cc:   Brandt Loosen, M.D.

## 2011-03-16 NOTE — Assessment & Plan Note (Signed)
[facility]                                 ON-CALL NOTE   ALIXANDREA, MILLESON                      MRN:          [id number]  DATE:12/14/2006                            DOB:          Mar 12, 1981    Description of call:  I got a call from the [employer] monitoring people  regarding Ms. Barzdinis.  She is a 30 year old patient of Dr. Lubertha Basque  with a history of palpitations.  She reported feeling weak and dizzy and  passing out tonight with loss of consciousness for two minutes while  lifting objects.  CardioNet monitor showed sinus tachycardia with a rate  of 115 up to 140.  There are no significant dysrhythmias.  The patient  was contacted by the [employer] services.  She says she feels much better  and is not interested in seeking further evaluation at this point.     Bevelyn Buckles. Bensimhon, MD  Electronically Signed    DRB/MedQ  DD: 12/14/2006  DT: 12/14/2006  Job #: [id number]

## 2011-03-16 NOTE — Discharge Summary (Signed)
[place]. [facility]  Patient:    [name], [name]                    MRN: [id number] Adm. Date:  [id number] Disc. Date: [id number] Attending:  Lesly Dukes                           Discharge Summary  FINAL DIAGNOSIS: 1.  Status migrainous with migraine without aura. 2.  Chronic migraine headaches. 3.  Morbid obesity, 278.01. 4.  Depression. 5.  Left anterior temporal lobe lesion, etiology unclear.  PROCEDURES: 1.  MRI of the brain. 2.  Treatment with dihydroergotamine intravenously. 3.  EEG.  COMPLICATIONS:  None.  HOSPITAL COURSE:  The patient was admitted to the hospital on an urgent basis because of intractable headaches.  As part of her work-up MRI scan of the brain  showed a left anterior temporal structure that had some evidence of cystic component but to closer inspection was actually a demyelinated region more consistent with a hamartoma, although a low-grade glioma could not be ruled out.  The lesion had no mass effect edema and no other abnormalities were seen.  EEG was performed and was normal.  The patient had initial hypertension which declined to normotensive state after she had relief from her headaches with repeated injections of dihydroergotamine 45 1 ml preceded by 10 mg of Reglan, also treated initially with Decadron 10 mg.  The patient continued for nine courses.  She was discharged with plans to place her on Migranal for rescue therapy.  There was a great deal of concern by the parents and the patient with respect to the nature of the lesion in the left anterior temporal lobe.  I assured the family that this was not responsible for her headaches but it did require further follow-up.  WBC was 10,400, hemoglobin 12.2, hematocrit 37.8, MCV 74.3; platelet count 430,000; 63% polys, 30% lymphs, 4% monos, 2% basophils, 1% large unidentified cells. Sedimentation rate 19.  Sodium 140, potassium 3.6, chloride 107, CO2  29, glucose 105, BUN 7, creatinine 1.0.  Calcium 9.1, total protein 7.0, albumin 3.3, AST 25, ALT 20, ALP 104, total bilirubin 0.3.  TSH 2.71.  Compliment C3 161, C4 25. CH50 118.  All of these are normal.  Anti-DNA negative.  Anti-ANA negative.  DISCHARGE CONDITION:  The patient was discharged in improved condition.  DISCHARGE MEDICATIONS: 1.  Atenolol. 2.  Migranal. 3.  Ultram.  DISPOSITION:  She will be followed up in the office.  Referral to a tertiary care center for second opinion concerning the left anterior temporal lobe lesion will be considered. DD:  12/30/99 TD:  01/01/00 Job: [id number]

## 2011-03-16 NOTE — Op Note (Signed)
NAMECRISTY, [name]             ACCOUNT NO.:  [id number]   MEDICAL RECORD NO.:  [id number]          PATIENT TYPE:  INP   LOCATION:  0473                         [facility]AC[facility]   PHYSICIAN:  Thornton Park. Daphine Deutscher, M.D.DATE OF BIRTH:  11-Jul-1981   DATE OF PROCEDURE:  07/31/2004  DATE OF DISCHARGE:                                 OPERATIVE REPORT   PROCEDURE:  Endoscopy.   This is a 30 year old morbidly obese female undergoing laparoscopic bypass.   DESCRIPTION OF PROCEDURE:  The upper endoscope was placed with minimal  difficulty but under direct vision to the upper digestive tract down to the  esophagus.  The Z line was felt to be around 40 cm with about a 4 cm long  pouch.  Insufflation of the pouch enabled the pouch to be distended and  submersed, and no evidence of bubbles were noted intra-abdominally.  There  is no evidence of bleeding within the pouch, and a patent anastomosis was  visualized and photographed.  The foregut was then decompressed, and the  scope withdrawn.      MBM/MEDQ  D:  07/31/2004  T:  07/31/2004  Job:  [id number]

## 2011-03-16 NOTE — H&P (Signed)
[place]. [facility]  Patient:    [name]                     MRN: [id number] Adm. Date:  [id number] Attending:  Mick Sell                         History and Physical  HISTORY OF PRESENT ILLNESS:  Ms. [name] is a 30 year old female, GI, PO, who presents at [redacted] weeks gestation [facility] 11/28/99), complaining of leakage of fluid.  The patient had been followed at [facility].  OBJECTIVE:  VITAL SIGNS:  Temperature 98.7, pulse 104, respirations 20, blood pressure 116/67.  PELVIC:  Uterine contractions are mild and are every three to seven minutes apart. The contraction stress test is negative.  The cervix is fingertip, 25% effaced nd -3 station.  There is no pooling.  Ferning is negative.  Nitrazine is negative.  ASSESSMENT:  Uterine contractions, but no rupture of membranes and no labor.  PLAN:  The patient will be discharged to home.  She will return if her contractions intensify.  She will return to the office in one week for examination if necessary. DD:  11/22/99 TD:  11/22/99 Job: [id number]

## 2011-03-16 NOTE — Discharge Summary (Signed)
[place]  Patient:    [name], [name]                    MRN: [id number] Adm. Date:  [id number] Disc. Date: [id number] Attending:  Lesly Dukes                           Discharge Summary  DATE OF BIRTH:  Jun 10, 1981  FINAL DIAGNOSIS: 1.  Migraine without aura, status migrainous, 346.11. 2.  Transformed headache disorder. 3.  Morbid obesity. 4.  Acne. 5.  Left anterior temporal hamartoma.  PROCEDURES: 1.  Cerebral angiogram, three vessel.  COMPLICATIONS:  None.  HOSPITAL COURSE:  The patient is a 30 year old woman who was admitted to [facility] on November 22, 1999 with status migrainous.  She was treated and responded to a DHE protocol.  Migranal at home, however, has not helped.  She has had increasing photophobia, nausea, vomiting, and occipital headaches with minimal stiffness.  She was admitted for further observation and treatment.  The patient was evaluated with cerebral arteriogram.  In part this was done to evaluate vasculitis and in part because of prior history of granulomatous angiitis in her father.  The arteriogram was normal.  The patient has been on DHE protocol.  This is day #3 of the protocol and she looks better.  She wishes to go home.  At present, we do not have a prophylactic medication that has been effective. e will increase Atenolol to 50 mg b.i.d., continue her Ultram for rescue.  Other medications that she currently takes are Sumycin 250 mg a day for acne, and oral contraceptives to regulate her periods.  This may be a factor in her headaches ut the headaches antedated use of the medication.  ACTIVITY:  The patient is to go to school as her headaches permit.  DIET:  She is to consume a low-fat/low-calorie diet for weight loss.  DISPOSITION:  I will be working to obtain consultation with the [facility].  If headaches persist we will need to perform a lumbar puncture to  look for pseudotumor cerebri and for CSF inflammation.  The family had wished this done under fluoroscopy.  I can feel the patients vertebral processes and believe that by sitting her up we can be successful with a lumbar puncture, and would perform this myself.  The patient does not wish to have a lumbar puncture now as her headaches are better and she wishes to go home.  DISCHARGE PHYSICAL EXAMINATION:  VITAL SIGNS:  Her assessment today shows a blood pressure of 110/58 resting, pulse 76, respirations 20, temperature 98.4 degrees.  HEENT:  Head and neck examination shows no localized tenderness.  Her headaches are two to three on a scale of ten at this time.  LUNGS:  Clear.  HEART:  No murmurs.  Pulses normal.  EXTREMITIES:  Well formed.  NEUROLOGIC:  Cranial nerves intact.  Motor, normal strength.  Gait and station ot tested.  DISCHARGE CONDITION:  Improved.  Plans are as noted above. DD:  12/20/99 TD:  12/20/99 Job: [id number]

## 2011-03-16 NOTE — Op Note (Signed)
NAMEMAEGHAN, CANNY             ACCOUNT NO.:  [id number]   MEDICAL RECORD NO.:  [id number]          PATIENT TYPE:  INP   LOCATION:  0473                         FACILITY:  [facility]   PHYSICIAN:  Sharlet Salina T. Hoxworth, M.D.DATE OF BIRTH:  05-27-1981   DATE OF PROCEDURE:  07/31/2004  DATE OF DISCHARGE:                                 OPERATIVE REPORT   PREOPERATIVE DIAGNOSIS:  Morbid obesity.   POSTOPERATIVE DIAGNOSIS:  Morbid obesity.   SURGICAL PROCEDURE:  Laparoscopic Roux-en-Y gastric bypass.   SURGEON:  [name], M.D.   ASSISTANT:  Thornton Park. Daphine Deutscher, M.D.   ANESTHESIA:  General.   HISTORY:  Cason Dabney is a 30 year old white female with an essentially  life-long history of progressive morbid obesity.  She has been through  multiple attempts at medically supervised and unsupervised weight loss  programs over the years and would lose a significant amount of weight but  quickly regained the weight and gain additional weight.  She now presents at  300 pounds with a BMI of 51.2.  She has worsening asthma, shortness of  breath, and joint pain.  After an extensive discussion of options, we have  elected to proceed with laparoscopic Roux-en-Y gastric bypass for surgical  correction of her obesity.  The nature of the procedure and risks were  discussed extensively and detailed elsewhere.  Following a thorough workup  detailed elsewhere, she is brought to the operating room for this procedure.   DESCRIPTION OF OPERATION:  The patient was brought to the operating room and  placed in the supine position on the operating table and general  endotracheal anesthesia was induced.  She had undergone a mechanical  antibiotic bowel prep.  She was given preoperative antibiotics.  Lovenox 40  mg was given subcutaneously.  PFs were placed.  The abdomen was widely  sterilely prepped and draped.  Local anesthesia was induced to infiltrate  the trocar sites prior to the incisions.   Through a 1-cm incision in the  left subcostal space, the abdomen was accessed and pneumoperitoneum was  established without difficulty with the Optiview trocar.  Under direct  vision, a 12-mm trocar was placed through the falciform ligament in the  right upper quadrant, another in the right mid abdomen, another in the left  mid abdomen, and a 5-mm in the left flank.  The omentum and transverse colon  were elevated and the ligament of Treitz identified.  A 40-cm afferent limb  was measured and then we went down about an additional 5-10 cm to get a more  mobile portion of the mesentery, which was quite mobile up toward the edge  of the liver.  At this point, the small intestine was divided with a single  firing of the Endo GIA 45-mm wide load stapler.  The mesentery was further  divided down to the first arcade with the harmonic scalpel for mobility.  The ends of the valve were healthy and well perfused.  A Penrose drain was  sutured to the end of the efferent limb for identification.  Following this,  a 100-cm efferent limb was carefully measured.  At this point, a side-to-  side jejunojejunostomy was created between the afferent and efferent limb  with harmonic scalpel enterotomies and a single firing of the Endo GIA 45-mm  wide load stapler.  The common enterotomy was then closed with running 2-0  Vicryl on either end of the enterotomy and tied centrally.  The mesenteric  defect behind the afferent limb was then closed with a running 2-0 silk  carried up onto the jejunojejunostomy.  Suture and staple lines of the  jejunojejunostomy were then coated with Tisseel tissue sealant.  The patient  was then placed in reverse Trendelenburg.  Through a 5-mm site in the  epigastrium, the Nathanson retractor was placed and the left lobe of the  liver elevated with good visualization of the hiatus and stomach.  Initially  the peritoneum over the angle of His was incised and the angle of His  mobilized  down along the left crux.  Following this, a point along the  lesser curve was identified for division about 4 cm from the  gastroesophageal junction.  The peritoneum adjacent to the stomach and  dissection was carried along the gastric wall posteriorly at right angles.  This was carried back for several centimeters and then with all tubes  removed an initial firing of the Endo GIA blue load stapler was performed at  right angles to the lesser curve.  Further dissection behind the pouch then  entered the lesser sac freely.  Several more staple firings were then used  to create a small pouch directing the staple line up toward the angle of  His.  The final firing was along the left crux with the Ewald tube through  the EG junction and the pouch was seen to be completely divided from the  gastric remnant.  The staple line along the gastric remnant was then  oversewn with running 2-0 Vicryl locking suture for hemostasis.  The  efferent limb was brought up to the pouch for anastomosis without any  detention in the antecolic position.  An initial posterior row of running 2-  0 Vicryl was placed between the efferent limb and the staple line along the  gastric pouch.  Ewald tube was then passed down to the tip of the gastric  pouch and enterotomies created in the stomach and jejunal limb with the  harmonic scalpel.  Anastomosis was then performed with the 45-mm blue load  linear stapler creating an approximately 2.5 cm anastomosis.  The staple  line was inspected and was intact without bleeding.  Common enterotomy was  then closed from either end with running 2-0 Vicryl.  The Ewald tube was  then passed down through the anastomosis into the efferent limb and an outer  layer of running 2-0 Vicryl seromuscular suture was placed.  Following this,  the Ewald tube was withdrawn.  With the efferent limb clamped near the gastrojejunostomy, the patient was endoscoped by Dr. Daphine Deutscher and with the  pouch  tightly distended under saline, there was no evidence of any leak.  Air was suctioned.  Tisseel tissue sealant was then placed over the suture  and staple lines of the gastrojejunostomy.  A closed suction drain was  brought out through the left lateral trocar site and left subhepatic space.  Trocars were removed under direct vision and all CO2 evacuated.  Sponge,  needle, and instrument counts were correct.  The skin incisions were closed  with staples.  The patient was then transported to the recovery room in good  condition.      BTH/MEDQ  D:  07/31/2004  T:  07/31/2004  Job:  [id number]

## 2011-03-16 NOTE — H&P (Signed)
[place]  Patient:    [name]                     MRN: [id number] Adm. Date:  [id number] Attending:  Mick Sell CC:         Oley Balm. Georgina Pillion, M.D.                         History and Physical  DATE OF BIRTH:  Dec 07, 1980.  CHIEF COMPLAINT:  headaches.  HISTORY OF PRESENT ILLNESS:  [name] is a 30 year old right-handed, single, obese, depressed young woman with a history of progressive migraine headaches that began in 1997.  These intensified over the past 4-6 months.  She experiences headaches 5-7 days per week.  Currently she is in the ninth day of continuous headache. he is seen in consultation today on an urgent basis for evaluation and admitted to  [facility] for evaluation and treatment.  She describes her headaches as biparietal with vertex pounding pain with nausea and vomiting, photophobia, right-headedness, occasional diplopia.  They are incapacitating.  Treatments that have been tried have included Tylenol, Tylenol #3 (6-9 per day for the last 4-5 days), Vioxx 25 mg per day, Imitrex in all three different types, IV, nasal, and tablet, Emerge, Stadol, and Zomig.  The latter caused hives.  Prophylactic treatments have included imipramine which was escalated from 10 mg to 100 mg per day over the past six weeks.  Her depression was lessened but not her headaches.  Topimax was also tried with sedation and distal paresthesias.  She has not had trials of beta blockers, calcium channel blockers, or Depakote.  She was evaluated by Dr. Perley Jain in December and January, three visits, diagnosis of migraine was made and a variety of treatments were started, some of which are described above.  She has also been seen by a psychologist at [facility].  Diagnosis is unknown to the family.  The only treatment that has helped the patient was a double dose of prednisone given earlier this  month  which lessened her pain.  The pain worsened with a taper of the drug.  MRI scan of the brain today revealed anterior temporal cystic structure without  mass effect edema.  No contrast was given.  Patient did not have sinus disease,  vascular changes, demyelination, ventriculomegaly, hemorrhage, _________ malformation, or any other source for headaches.  PAST MEDICAL HISTORY:  Normal birth history, normal growth and development.  PAST SURGICAL HISTORY:  Patient was hospitalized for necrotic gallbladder with cholecystectomy.  She has had myringotomy and tonsillectomy and adenoidectomy and appendectomy.  REVIEW OF SYSTEMS:  See above.  Patient is morbidly obese.  Mother believes she has had change in hygiene, moods, and concentration.  She is having difficulty getting to sleep and staying asleep.  She has missed school and has dropping grades. She has had rapid heart rate.  She has not had intercurrent _________ in the head, neck, lungs, GI, GU.  No fevers or night sweats.  She has not had weight loss despite complaining of anorexia.  She has had no trauma to her head. Neurological review of systems is negative except as noted above.  The same is true for general review of systems.  SOCIAL HISTORY:  Patient dropped out of school last year.  She had a GPA of 3.6-3.8 prior to onset of her headaches which had dropped to 3.4  in 1998.  She restarted her senior year I believe in [place], despite the fact that she lives in [place].  She is in danger of failing.  She has had severe life stress since age 15 when her father had multiple strokes and was placed in a skilled nursing facility.  She has become a compulsive eater. er mother has divorced and remarried.  Patient is a trained vocalist.  FAMILY HISTORY:  Father has granulomatous angiitis but prior to this time he had migranous headaches, elevated antiphospholipid antibodies, hypertension, seizures, and tremors.   He was placed on Coumadin for awhile, but that did not prevent the strokes.  Finally a biopsy showed the true diagnosis.  There is no family history of migraines other than father.  Patients mother and patient herself had febrile seizures.  The patient had hers after vaccination for pertussis.  CURRENT MEDICATIONS: 1. Nircette for menstrual irregularities and possible cystic ovaries. 2. Imipramine 100 mg at bedtime for pain and depression. 3. Tylenol #3, abortive treatment for headaches.  ALLERGIES:  ZOMIG, hives, adverse reactions, TOPIMAX causes sedation and paresthesias, PERTUSSIS IMMUNIZATION may have caused her seizure.  PHYSICAL EXAMINATION:  GENERAL:  Morbidly obese girl in moderate distress, cooperative, pleasant, but depressed.  VITAL SIGNS:  Blood pressure 140/100, resting pulse 108, weight 240 pounds, height 64-3/4 inches.  HEENT:  No signs of infection.  LUNGS:  Clear.  HEART:  No murmurs, pulses normal.  ABDOMEN:  Protuberant, no hepatosplenomegaly.  EXTREMITIES:  No edema, cyanosis, change in tone, or _________ .  NEUROLOGIC:  She had a tender left eye, no tenderness other places.  Awake, oriented, no dysphagia or dyspraxia according to _________ examination.  Round,  reactive pupils, normal fundi with positive venous pulsations to the left eye but sharp disc margins bilaterally.  Visual fields full to double simultaneous stimuli. OKN responses equal.  Visual acuity was not tested.  Symmetric facial strength,  midline tongue and uvula, air conduction greater than bone conduction bilaterally.  Motor examination:  Normal strength, tone, and mass.  Good fine motor movements, no pronator drift.  Sensation intact to cold vibration, stereognosis, two point discrimination, 3-4 mm bilaterally.  Cerebellar examination:  Good finger-to-nose _________ movements.  Gait was waddling.  She can get up on her toes and heels.  She had difficulty with hand and  negative Romberg.  Deep tendon reflexes diminished but equal bilateral flexor plantar responses.  IMPRESSION: 1. Status migranous. 2. 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  3. Morbid obesity. 4. Depression. 5. 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  PLAN: 1. DHE protocol. 2. Start beta blocker (atenolol). 3. Complete MRI scan with Gadolinium. 4. Consider psychiatric consult. DD:  11/22/99 TD:  11/22/99 Job: [id number]

## 2011-03-16 NOTE — Assessment & Plan Note (Signed)
[name]                         ELECTROPHYSIOLOGY OFFICE NOTE   [name], [name]                    MRN:          [id number]  DATE:02/18/2007                            DOB:          05/12/1981    REASON FOR VISIT:  The patient returns today for follow up.   HISTORY OF THE PRESENT ILLNESS:  The patient is a very pleasant young  woman with a history of tachypalpitations and recurrent episodes of near  syncope whom I initially saw back in January.  At that time we had her  wear a cardiac monitor.  Her history is notable in that she is status  post bariatric surgery for obesity with marked weight loss.  She has  been very active; and, carries the diagnosis of bipolar disorder.  The  patient continues to have periods of tachypalpitations.  She wore a  cardiac monitor where she had heart rates up to 165-170 beats per  minute.  Whether this is an atrial tachycardia or sinus tachycardia is  not known, but these typically occur with no exertion and the  palpitations last for up to an hour.  During these periods she becomes  lightheaded and has nearly passed out.  She also gets short of breath  and dizzy.  She returns today for evaluation.   Of note the patient was to be on beta blockers at initially 25 mg daily  and is now up to Toprol 75 mg a day.  Despite this she continues to have  symptomatic tachypalpitations.   PHYSICAL EXAMINATION:  GENERAL APPEARANCE:  On physical exam today she  is a pleasant well-appearing woman in no distress.  VITAL SIGNS:  The blood pressure is 120/82.  The pulse is 110 and  regular.  Respirations are 18.  The weight is 178 pounds.  NECK:  The neck reveals no jugular venous distention.  There is no  thyromegaly.  Trachea is midline.  LUNGS:  The lungs are clear to auscultation.  No wheezes, rales or  rhonchi.  HEART:  The cardiovascular exam reveals a regular rate and rhythm with  normal S1 and S2.  EXTREMITIES:   The extremities demonstrate no edema.   LABORATORY DATA:  Electrocardiogram demonstrates sinus tachycardia.   IMPRESSION:  1. Palpitations and documented ST-T, questionable atrial tachycardia      versus sinus tachycardia versus unusual atrioventricular reentrant      tachycardia.  2. Dizziness secondary to number one.   DISCUSSION:  I discussed the treatment options with the patient and her  mother in detail.  It is unclear whether or not she has true SVT or  sinus tachycardia.  I recommend because of her severe symptoms despite  her medical therapy that we proceed with head up tilt table testing  followed by EP study; and, if positive, catheter ablation.   We will see the patient back in the office following this.   I have discussed the treatment options with the patient in detail; and,  the risks, benefits, goals and expectations have been discussed; and,  she would like to proceed with this.  Doylene Canning. Ladona Ridgel, MD  Electronically Signed    GWT/MedQ  DD: 02/18/2007  DT: 02/18/2007  Job #: [id number]   cc:   Bevelyn Buckles. Bensimhon, MD

## 2011-03-16 NOTE — Assessment & Plan Note (Signed)
[facility]                         ELECTROPHYSIOLOGY OFFICE NOTE   DEREON, WILLIAMSEN                    MRN:          [id number]  DATE:01/30/2007                            DOB:          14-Apr-1981    Ms. Barzdins returns today for followup.  She is a very pleasant, 30-  year-old woman with a history of tachypalpitations, who has had  sensations of rapid heart racing and dizzy spells now off and on for  many months.  I saw the patient initially back in January after being  referred by Dr. Wende Bushy.  At that time, she carried multiple diagnoses  including bipolar disorder and morbid obesity and was status post  bariatric surgery.  She also had problems with migraines.  The patient  continued to have palpitations, and we had her wear a three-week cardiac  monitor, which demonstrates fairly significant bouts of sinus  tachycardia with heart rates up to 160 or 70 beat per minute at times,  and resting heart rate typically running 100 to 110 or 15 beat per  minute range.  The patient states that she is quite busy daily working a  full time job and going to school.  Her job requires that she be  standing for many hours at a time and walking quite regularly, although  she denies any actual regular exercise.   PHYSICAL EXAMINATION:  GENERAL:  She is a pleasant, young-appearing  woman in no acute distress.  VITAL SIGNS:  The blood pressure today was 126/88, the pulse was 100 and  regular, the respirations were 18, and the weight was 177 pounds.  NECK:  No jugulovenous distention.  LUNGS:  Clear bilaterally to auscultation.  CARDIOVASCULAR:  Regular tachycardia with normal S1 and S2.  There was a  soft S4 gallop present.  ABDOMEN:  Soft and nontender.  EXTREMITIES:  No cyanosis, clubbing, or edema.  The pulses were 2+ and  symmetric.   EKG demonstrated sinus tachycardia.   IMPRESSION:  1. Inappropriate sinus tachycardia.  2. Palpitations secondary  to number one.   DISCUSSION:  While I cannot definitively exclude an atrial tachycardia  arising from the heart atrium, her EKG very strongly suggests sinus  tachycardia.  I have recommended that she restart her beta blocker, and  I have given her a prescription today for Toprol 25 mg a day for 7 days  followed by up titration to 50 mg daily.  I have also given her a  prescription for starting a regular exercise program by walking on a  treadmill or an exercise bike for at least 30 minutes  and up to 60 minutes per day.  I will plan to see the patient back in  the office in followup in several months.     Doylene Canning. Ladona Ridgel, MD  Electronically Signed    GWT/MedQ  DD: 01/30/2007  DT: 01/30/2007  Job #: [id number]   cc:   Valora Piccolo. Wende Bushy, M.D.

## 2011-03-16 NOTE — Discharge Summary (Signed)
NAMEJENNILEE, [name]             ACCOUNT NO.:  [id number]   MEDICAL RECORD NO.:  [id number]          PATIENT TYPE:  INP   LOCATION:  0473                         FACILITY:  [facility]   PHYSICIAN:  Sharlet Salina T. Hoxworth, M.D.DATE OF BIRTH:  09/07/1981   DATE OF ADMISSION:  07/31/2004  DATE OF DISCHARGE:  08/02/2004                                 DISCHARGE SUMMARY   DISCHARGE DIAGNOSES:  1.  Morbid obesity.  2.  Asthma.   OPERATIONS/PROCEDURES:  Laparoscopic Roux-en-Y gastric bypass, July 31, 2004.   HISTORY OF PRESENT ILLNESS:  [name] is 30 year old female with an  essentially life long history of progressive obesity unresponsive to medical  management.  After extensive work up and discussion detailed elsewhere, we  have elected to proceed with laparoscopic Roux-en-Y gastric bypass.  She is  admitted for this procedure.   PAST MEDICAL HISTORY:  Surgery includes tonsillectomy, appendectomy, and  ovarian cystectomy. Also laparoscopic cholecystectomy.   Medically she is treated for depression, hypothyroidism, asthma and migraine  headaches.   MEDICATIONS:  1.  Effexor XR 300 mg daily.  2.  Seroquel 100 mg 2 tablets t.i.d.  3.  Ortho-Cyclen birth control pills.  4.  Synthroid 125 mg daily.  5.  Doxycycline 100 mg daily for acne.  6.  Cipro hepzidine 4 mg b.i.d. for hives.  7.  Advair Diskus daily.   ALLERGIES:  PERTUSSIS VACCINE, PENICILLIN, TOPAMAX AND SHELLFISH.   SOCIAL HISTORY, FAMILY HISTORY AND REVIEW OF SYSTEMS - See dictated H&P.   PHYSICAL EXAMINATION:  VITAL SIGNS:  She is 5 feet 4 inches, 300 pounds, BMI  of 52.  Afebrile, vital signs unremarkable.  GENERAL:  Significant only for morbid obesity.   HOSPITAL COURSE:  The patient was admitted on the morning of her procedure  and underwent an uneventful laparoscopic Roux-en-Y gastric bypass.  Her  postoperative course was very smooth.  She had a negative Gastrografin  swallow on the first postoperative day  and was started on clear liquids.  Lower extremity Dopplers were negative.  She had no nausea or significant  abdominal pain.  On the second postoperative day she was started on  unlimited clears and then advanced to protein shakes which she tolerated  well, and is discharged home on the second postoperative day.  Drain was in  place with serosanguineous drainage.  Follow up CBC was normal.  Abdomen was  soft and nontender.   DISCHARGE MEDICATIONS:  Are the same as admission plus Roxicet elixir for  pain.   FOLLOW UP:  Is in my office in 5 days for drain removal.      BTH/MEDQ  D:  08/14/2004  T:  08/15/2004  Job:  [id number]   cc:   Brandt Loosen, M.D.

## 2011-05-12 ENCOUNTER — Emergency Department (HOSPITAL_COMMUNITY): Payer: Managed Care, Other (non HMO)

## 2011-05-12 ENCOUNTER — Emergency Department (HOSPITAL_COMMUNITY)
Admission: EM | Admit: 2011-05-12 | Discharge: 2011-05-12 | Disposition: A | Discharge status: Home / Self Care | Payer: Managed Care, Other (non HMO) | Attending: Emergency Medicine | Admitting: Emergency Medicine

## 2011-05-12 DIAGNOSIS — N83209 Unspecified ovarian cyst, unspecified side: Secondary | ICD-10-CM | POA: Insufficient documentation

## 2011-05-12 DIAGNOSIS — N949 Unspecified condition associated with female genital organs and menstrual cycle: Secondary | ICD-10-CM | POA: Insufficient documentation

## 2011-05-12 DIAGNOSIS — O99891 Other specified diseases and conditions complicating pregnancy: Secondary | ICD-10-CM | POA: Insufficient documentation

## 2011-05-12 DIAGNOSIS — E039 Hypothyroidism, unspecified: Secondary | ICD-10-CM | POA: Insufficient documentation

## 2011-05-12 DIAGNOSIS — F411 Generalized anxiety disorder: Secondary | ICD-10-CM | POA: Insufficient documentation

## 2011-05-12 DIAGNOSIS — I498 Other specified cardiac arrhythmias: Secondary | ICD-10-CM | POA: Insufficient documentation

## 2011-05-12 DIAGNOSIS — R1031 Right lower quadrant pain: Secondary | ICD-10-CM | POA: Insufficient documentation

## 2011-05-12 DIAGNOSIS — O34599 Maternal care for other abnormalities of gravid uterus, unspecified trimester: Secondary | ICD-10-CM | POA: Insufficient documentation

## 2011-05-12 DIAGNOSIS — M329 Systemic lupus erythematosus, unspecified: Secondary | ICD-10-CM | POA: Insufficient documentation

## 2011-05-12 DIAGNOSIS — O210 Mild hyperemesis gravidarum: Secondary | ICD-10-CM | POA: Insufficient documentation

## 2011-05-12 LAB — URINALYSIS, ROUTINE W REFLEX MICROSCOPIC
Leukocytes, UA: NEGATIVE
Protein, ur: NEGATIVE mg/dL
Specific Gravity, Urine: 1.026 (ref 1.005–1.030)
Urobilinogen, UA: 0.2 mg/dL (ref 0.0–1.0)

## 2011-05-12 LAB — CBC
HCT: 34.8 % — ABNORMAL LOW (ref 36.0–46.0)
Platelets: 586 10*3/uL — ABNORMAL HIGH (ref 150–400)
RBC: 3.99 MIL/uL (ref 3.87–5.11)
RDW: 15.9 % — ABNORMAL HIGH (ref 11.5–15.5)
WBC: 9.1 10*3/uL (ref 4.0–10.5)

## 2011-05-12 LAB — DIFFERENTIAL
Basophils Absolute: 0 10*3/uL (ref 0.0–0.1)
Eosinophils Absolute: 0.1 10*3/uL (ref 0.0–0.7)
Eosinophils Relative: 1 % (ref 0–5)
Lymphocytes Relative: 24 % (ref 12–46)
Neutrophils Relative %: 69 % (ref 43–77)

## 2011-05-12 LAB — BASIC METABOLIC PANEL
BUN: 12 mg/dL (ref 6–23)
CO2: 27 mEq/L (ref 19–32)
Chloride: 104 mEq/L (ref 96–112)
GFR calc Af Amer: >60 mL/min (ref >60)
Glucose, Bld: 96 mg/dL (ref 70–99)
Potassium: 4 mEq/L (ref 3.5–5.1)

## 2011-05-12 LAB — URINE MICROSCOPIC-ADD ON

## 2011-07-20 LAB — DIFFERENTIAL
Basophils Absolute: 0
Basophils Relative: 0
Eosinophils Absolute: 0.1
Monocytes Absolute: 0.4
Monocytes Relative: 5
Neutrophils Relative %: 64

## 2011-07-20 LAB — T4, FREE: Free T4: 2.06 — ABNORMAL HIGH

## 2011-07-20 LAB — HEPATIC FUNCTION PANEL
Bilirubin, Direct: 0.1
Total Bilirubin: 0.6

## 2011-07-20 LAB — BASIC METABOLIC PANEL
Chloride: 106
Creatinine, Ser: 0.74
Potassium: 3.4 — ABNORMAL LOW
Sodium: 140

## 2011-07-20 LAB — TSH: TSH: 0.018 — ABNORMAL LOW

## 2011-07-20 LAB — CBC
Hemoglobin: 12.2
MCHC: 33.9
MCV: 87.9
RBC: 4.09
RDW: 13.2

## 2011-07-20 LAB — ETHANOL

## 2011-07-20 LAB — PREGNANCY, URINE

## 2011-07-20 LAB — URINALYSIS, ROUTINE W REFLEX MICROSCOPIC
Bilirubin Urine: NEGATIVE
Nitrite: NEGATIVE
Specific Gravity, Urine: 1.015
pH: 6

## 2011-07-20 LAB — RAPID URINE DRUG SCREEN, HOSP PERFORMED: Benzodiazepines: POSITIVE — AB

## 2011-07-20 LAB — SALICYLATE LEVEL: Salicylate Lvl: <4

## 2011-07-26 LAB — URINALYSIS, ROUTINE W REFLEX MICROSCOPIC
Ketones, ur: NEGATIVE
Nitrite: NEGATIVE
Protein, ur: NEGATIVE
Urobilinogen, UA: 1

## 2011-07-26 LAB — DIFFERENTIAL
Basophils Absolute: 0.1
Eosinophils Relative: 1
Lymphocytes Relative: 40
Neutro Abs: 4.4
Neutrophils Relative %: 52

## 2011-07-26 LAB — POCT I-STAT, CHEM 8
BUN: 5 — ABNORMAL LOW
Calcium, Ion: 1.21
Chloride: 106
Creatinine, Ser: 0.9
Glucose, Bld: 92
HCT: 45
Hemoglobin: 15.3 — ABNORMAL HIGH
Potassium: 3.7
Sodium: 142
TCO2: 28

## 2011-07-26 LAB — ANA: Anti Nuclear Antibody(ANA): NEGATIVE

## 2011-07-26 LAB — ROCKY MTN SPOTTED FVR AB, IGM-BLOOD: RMSF IgM: 0.14 IV

## 2011-07-26 LAB — CK: Total CK: 35

## 2011-07-26 LAB — CBC
Platelets: 345
RDW: 14.6

## 2011-08-03 LAB — COMPREHENSIVE METABOLIC PANEL
ALT: 11
Albumin: 3.7
Alkaline Phosphatase: 68
Potassium: 3.3 — ABNORMAL LOW
Sodium: 140
Total Protein: 6.9

## 2011-08-03 LAB — HEPATIC FUNCTION PANEL
AST: 16
Albumin: 3.7

## 2011-08-03 LAB — POCT PREGNANCY, URINE
Operator id: 29459
Preg Test, Ur: NEGATIVE

## 2011-08-03 LAB — CBC
HCT: 38.8
Hemoglobin: 13.4
Hemoglobin: 13.4
MCHC: 34.5
Platelets: 364
RDW: 14.3
RDW: 14.4

## 2011-08-03 LAB — BASIC METABOLIC PANEL
CO2: 27
Calcium: 8.9
Glucose, Bld: 114 — ABNORMAL HIGH
Potassium: 3.7
Sodium: 139

## 2011-08-03 LAB — DIFFERENTIAL
Basophils Absolute: 0
Basophils Relative: 0
Basophils Relative: 0
Eosinophils Absolute: 0 — ABNORMAL LOW
Eosinophils Absolute: 0.1 — ABNORMAL LOW
Eosinophils Relative: 1
Eosinophils Relative: 1
Monocytes Absolute: 0.6
Monocytes Absolute: 0.7
Monocytes Relative: 8

## 2011-08-03 LAB — URINALYSIS, ROUTINE W REFLEX MICROSCOPIC
Glucose, UA: NEGATIVE
Nitrite: NEGATIVE
Protein, ur: NEGATIVE
pH: 5

## 2011-08-03 LAB — ANA: Anti Nuclear Antibody(ANA): NEGATIVE

## 2011-08-03 LAB — INFLUENZA A & B ANTIBODIES
Influenza A Virus Ab, IgM: 0.05 IV
Influenza B ab, IgG: 1.02 IV

## 2011-08-03 LAB — RAPID STREP SCREEN (MED CTR MEBANE ONLY): Streptococcus, Group A Screen (Direct): NEGATIVE

## 2011-08-03 LAB — T4, FREE: Free T4: 1.81 — ABNORMAL HIGH

## 2011-08-03 LAB — SEDIMENTATION RATE: Sed Rate: 16

## 2011-08-03 LAB — VITAMIN B12 BINDING CAPACITY, BLOOD: Vitamin B12 Bind Capacity: 1907 pg/mL — ABNORMAL HIGH (ref 650–1340)

## 2011-08-15 ENCOUNTER — Inpatient Hospital Stay (HOSPITAL_COMMUNITY)
Admission: EM | Admit: 2011-08-15 | Discharge: 2011-09-11 | DRG: 326 | Disposition: A | Discharge status: Home / Self Care | Payer: Managed Care, Other (non HMO) | Attending: General Surgery | Admitting: General Surgery

## 2011-08-15 ENCOUNTER — Emergency Department (HOSPITAL_COMMUNITY): Payer: Managed Care, Other (non HMO)

## 2011-08-15 DIAGNOSIS — K311 Adult hypertrophic pyloric stenosis: Secondary | ICD-10-CM

## 2011-08-15 DIAGNOSIS — G43909 Migraine, unspecified, not intractable, without status migrainosus: Secondary | ICD-10-CM | POA: Diagnosis not present

## 2011-08-15 DIAGNOSIS — M329 Systemic lupus erythematosus, unspecified: Secondary | ICD-10-CM | POA: Diagnosis present

## 2011-08-15 DIAGNOSIS — D509 Iron deficiency anemia, unspecified: Secondary | ICD-10-CM | POA: Diagnosis present

## 2011-08-15 DIAGNOSIS — F329 Major depressive disorder, single episode, unspecified: Secondary | ICD-10-CM | POA: Diagnosis present

## 2011-08-15 DIAGNOSIS — G2581 Restless legs syndrome: Secondary | ICD-10-CM | POA: Diagnosis present

## 2011-08-15 DIAGNOSIS — K2100 Gastro-esophageal reflux disease with esophagitis, without bleeding: Secondary | ICD-10-CM | POA: Diagnosis present

## 2011-08-15 DIAGNOSIS — Z9884 Bariatric surgery status: Secondary | ICD-10-CM

## 2011-08-15 DIAGNOSIS — R45851 Suicidal ideations: Secondary | ICD-10-CM

## 2011-08-15 DIAGNOSIS — E860 Dehydration: Secondary | ICD-10-CM | POA: Diagnosis present

## 2011-08-15 DIAGNOSIS — F419 Anxiety disorder, unspecified: Secondary | ICD-10-CM | POA: Diagnosis present

## 2011-08-15 DIAGNOSIS — E282 Polycystic ovarian syndrome: Secondary | ICD-10-CM | POA: Diagnosis present

## 2011-08-15 DIAGNOSIS — Y832 Surgical operation with anastomosis, bypass or graft as the cause of abnormal reaction of the patient, or of later complication, without mention of misadventure at the time of the procedure: Secondary | ICD-10-CM | POA: Diagnosis present

## 2011-08-15 DIAGNOSIS — F39 Unspecified mood [affective] disorder: Secondary | ICD-10-CM | POA: Diagnosis present

## 2011-08-15 DIAGNOSIS — K9589 Other complications of other bariatric procedure: Principal | ICD-10-CM | POA: Diagnosis present

## 2011-08-15 DIAGNOSIS — N179 Acute kidney failure, unspecified: Secondary | ICD-10-CM | POA: Diagnosis present

## 2011-08-15 DIAGNOSIS — E876 Hypokalemia: Secondary | ICD-10-CM | POA: Diagnosis present

## 2011-08-15 DIAGNOSIS — F411 Generalized anxiety disorder: Secondary | ICD-10-CM | POA: Diagnosis present

## 2011-08-15 DIAGNOSIS — E039 Hypothyroidism, unspecified: Secondary | ICD-10-CM | POA: Diagnosis present

## 2011-08-15 DIAGNOSIS — K859 Acute pancreatitis without necrosis or infection, unspecified: Secondary | ICD-10-CM | POA: Diagnosis present

## 2011-08-15 HISTORY — DX: Encounter for other specified aftercare: Z51.89

## 2011-08-15 HISTORY — DX: Reserved for inherently not codable concepts without codable children: IMO0001

## 2011-08-15 HISTORY — DX: Anxiety disorder, unspecified: F41.9

## 2011-08-15 HISTORY — DX: Major depressive disorder, single episode, unspecified: F32.9

## 2011-08-15 HISTORY — DX: Gastro-esophageal reflux disease without esophagitis: K21.9

## 2011-08-15 HISTORY — DX: Headache: R51

## 2011-08-15 HISTORY — DX: Polycystic ovarian syndrome: E28.2

## 2011-08-15 HISTORY — DX: Hypothyroidism, unspecified: E03.9

## 2011-08-15 HISTORY — DX: Depression, unspecified: F32.A

## 2011-08-15 HISTORY — DX: Anemia, unspecified: D64.9

## 2011-08-15 LAB — DIFFERENTIAL
Basophils Absolute: 0 10*3/uL (ref 0.0–0.1)
Eosinophils Relative: 2 % (ref 0–5)
Lymphocytes Relative: 44 % (ref 12–46)
Monocytes Absolute: 0.6 10*3/uL (ref 0.1–1.0)

## 2011-08-15 LAB — GLUCOSE, CAPILLARY: Glucose-Capillary: 131 mg/dL — ABNORMAL HIGH (ref 70–99)

## 2011-08-15 LAB — COMPREHENSIVE METABOLIC PANEL
Albumin: 4.3 g/dL (ref 3.5–5.2)
Alkaline Phosphatase: 75 U/L (ref 39–117)
BUN: 20 mg/dL (ref 6–23)
Calcium: 9.8 mg/dL (ref 8.4–10.5)
Creatinine, Ser: 1.63 mg/dL — ABNORMAL HIGH (ref 0.50–1.10)
Potassium: 2.6 mEq/L — CL (ref 3.5–5.1)
Total Protein: 7.9 g/dL (ref 6.0–8.3)

## 2011-08-15 LAB — RAPID URINE DRUG SCREEN, HOSP PERFORMED
Amphetamines: NOT DETECTED
Barbiturates: NOT DETECTED
Tetrahydrocannabinol: NOT DETECTED

## 2011-08-15 LAB — URINE MICROSCOPIC-ADD ON

## 2011-08-15 LAB — URINALYSIS, ROUTINE W REFLEX MICROSCOPIC
Nitrite: NEGATIVE
Protein, ur: 100 mg/dL — AB
Urobilinogen, UA: 1 mg/dL (ref 0.0–1.0)

## 2011-08-15 LAB — CBC
HCT: 40.6 % (ref 36.0–46.0)
MCHC: 31.5 g/dL (ref 30.0–36.0)
RDW: 15.2 % (ref 11.5–15.5)

## 2011-08-15 LAB — LIPASE, BLOOD: Lipase: 226 U/L — ABNORMAL HIGH (ref 11–59)

## 2011-08-15 LAB — POCT PREGNANCY, URINE: Preg Test, Ur: NEGATIVE

## 2011-08-15 LAB — ETHANOL: Alcohol, Ethyl (B): <11 mg/dL (ref 0–11)

## 2011-08-16 ENCOUNTER — Inpatient Hospital Stay (HOSPITAL_COMMUNITY): Payer: Managed Care, Other (non HMO)

## 2011-08-17 DIAGNOSIS — F329 Major depressive disorder, single episode, unspecified: Secondary | ICD-10-CM

## 2011-08-17 LAB — COMPREHENSIVE METABOLIC PANEL
ALT: 10 U/L (ref 0–35)
AST: 26 U/L (ref 0–37)
Albumin: 2.5 g/dL — ABNORMAL LOW (ref 3.5–5.2)
CO2: 26 mEq/L (ref 19–32)
Calcium: 8 mg/dL — ABNORMAL LOW (ref 8.4–10.5)
GFR calc non Af Amer: >90 mL/min (ref >90)
Sodium: 139 mEq/L (ref 135–145)

## 2011-08-17 LAB — CBC
MCH: 26.1 pg (ref 26.0–34.0)
Platelets: 285 10*3/uL (ref 150–400)
RBC: 3.18 MIL/uL — ABNORMAL LOW (ref 3.87–5.11)
RDW: 15.4 % (ref 11.5–15.5)
WBC: 6 10*3/uL (ref 4.0–10.5)

## 2011-08-18 LAB — BASIC METABOLIC PANEL
BUN: 3 mg/dL — ABNORMAL LOW (ref 6–23)
CO2: 27 mEq/L (ref 19–32)
Calcium: 8.6 mg/dL (ref 8.4–10.5)
Chloride: 104 mEq/L (ref 96–112)
Creatinine, Ser: 0.75 mg/dL (ref 0.50–1.10)
Glucose, Bld: 75 mg/dL (ref 70–99)

## 2011-08-18 LAB — CBC
HCT: 33.3 % — ABNORMAL LOW (ref 36.0–46.0)
Hemoglobin: 10.1 g/dL — ABNORMAL LOW (ref 12.0–15.0)
MCH: 25.8 pg — ABNORMAL LOW (ref 26.0–34.0)
MCV: 84.9 fL (ref 78.0–100.0)
RBC: 3.92 MIL/uL (ref 3.87–5.11)
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
## 2011-08-19 LAB — CBC
HCT: 32.7 % — ABNORMAL LOW (ref 36.0–46.0)
Hemoglobin: 10.3 g/dL — ABNORMAL LOW (ref 12.0–15.0)
MCHC: 31.5 g/dL (ref 30.0–36.0)
MCV: 82.6 fL (ref 78.0–100.0)
RDW: 15.1 % (ref 11.5–15.5)

## 2011-08-20 ENCOUNTER — Inpatient Hospital Stay (HOSPITAL_COMMUNITY): Payer: Managed Care, Other (non HMO)

## 2011-08-20 DIAGNOSIS — R1115 Cyclical vomiting syndrome unrelated to migraine: Secondary | ICD-10-CM

## 2011-08-20 DIAGNOSIS — R748 Abnormal levels of other serum enzymes: Secondary | ICD-10-CM

## 2011-08-20 LAB — COMPREHENSIVE METABOLIC PANEL
ALT: 9 U/L (ref 0–35)
AST: 13 U/L (ref 0–37)
CO2: 26 mEq/L (ref 19–32)
Calcium: 9.3 mg/dL (ref 8.4–10.5)
Chloride: 103 mEq/L (ref 96–112)
Creatinine, Ser: 0.73 mg/dL (ref 0.50–1.10)
GFR calc Af Amer: >90 mL/min (ref >90)
GFR calc non Af Amer: >90 mL/min (ref >90)
Glucose, Bld: 79 mg/dL (ref 70–99)
Total Bilirubin: 0.2 mg/dL — ABNORMAL LOW (ref 0.3–1.2)
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
## 2011-08-21 ENCOUNTER — Other Ambulatory Visit: Payer: Self-pay | Admitting: Gastroenterology

## 2011-08-21 ENCOUNTER — Inpatient Hospital Stay (HOSPITAL_COMMUNITY): Payer: Managed Care, Other (non HMO)

## 2011-08-21 DIAGNOSIS — K56609 Unspecified intestinal obstruction, unspecified as to partial versus complete obstruction: Secondary | ICD-10-CM

## 2011-08-21 DIAGNOSIS — R748 Abnormal levels of other serum enzymes: Secondary | ICD-10-CM

## 2011-08-21 DIAGNOSIS — R112 Nausea with vomiting, unspecified: Secondary | ICD-10-CM

## 2011-08-21 DIAGNOSIS — K311 Adult hypertrophic pyloric stenosis: Secondary | ICD-10-CM

## 2011-08-21 DIAGNOSIS — R634 Abnormal weight loss: Secondary | ICD-10-CM

## 2011-08-21 DIAGNOSIS — R1115 Cyclical vomiting syndrome unrelated to migraine: Secondary | ICD-10-CM

## 2011-08-21 DIAGNOSIS — R933 Abnormal findings on diagnostic imaging of other parts of digestive tract: Secondary | ICD-10-CM

## 2011-08-21 LAB — IRON AND TIBC
Iron: 11 ug/dL — ABNORMAL LOW (ref 42–135)
TIBC: 233 ug/dL — ABNORMAL LOW (ref 250–470)

## 2011-08-21 LAB — COMPREHENSIVE METABOLIC PANEL
ALT: 8 U/L (ref 0–35)
AST: 12 U/L (ref 0–37)
Albumin: 3.1 g/dL — ABNORMAL LOW (ref 3.5–5.2)
CO2: 19 mEq/L (ref 19–32)
Calcium: 8.6 mg/dL (ref 8.4–10.5)
Creatinine, Ser: 0.64 mg/dL (ref 0.50–1.10)
Sodium: 142 mEq/L (ref 135–145)

## 2011-08-21 LAB — FOLATE: Folate: 13.8 ng/mL

## 2011-08-21 LAB — VITAMIN B12: Vitamin B-12: 917 pg/mL — ABNORMAL HIGH (ref 211–911)

## 2011-08-21 LAB — LEAD, BLOOD: Lead-Whole Blood: 0.1 ug/dL (ref <10.0)

## 2011-08-22 ENCOUNTER — Inpatient Hospital Stay (HOSPITAL_COMMUNITY): Payer: Managed Care, Other (non HMO)

## 2011-08-22 LAB — BASIC METABOLIC PANEL WITH GFR
BUN: 5 mg/dL — ABNORMAL LOW (ref 6–23)
CO2: 20 meq/L (ref 19–32)
Calcium: 8.3 mg/dL — ABNORMAL LOW (ref 8.4–10.5)
Chloride: 105 meq/L (ref 96–112)
Creatinine, Ser: 0.66 mg/dL (ref 0.50–1.10)
GFR calc Af Amer: >90 mL/min
GFR calc non Af Amer: >90 mL/min
Glucose, Bld: 53 mg/dL — ABNORMAL LOW (ref 70–99)
Potassium: 3.2 meq/L — ABNORMAL LOW (ref 3.5–5.1)
Sodium: 140 meq/L (ref 135–145)

## 2011-08-22 LAB — CBC
HCT: 31.2 % — ABNORMAL LOW (ref 36.0–46.0)
Hemoglobin: 9.5 g/dL — ABNORMAL LOW (ref 12.0–15.0)
MCV: 83.4 fL (ref 78.0–100.0)
RBC: 3.74 MIL/uL — ABNORMAL LOW (ref 3.87–5.11)
WBC: 5.5 10*3/uL (ref 4.0–10.5)

## 2011-08-22 LAB — PREALBUMIN: Prealbumin: 11.2 mg/dL — ABNORMAL LOW (ref 17.0–34.0)

## 2011-08-23 LAB — BASIC METABOLIC PANEL
CO2: 14 mEq/L — ABNORMAL LOW (ref 19–32)
Chloride: 105 mEq/L (ref 96–112)
Glucose, Bld: 46 mg/dL — ABNORMAL LOW (ref 70–99)
Potassium: 3.6 mEq/L (ref 3.5–5.1)
Sodium: 137 mEq/L (ref 135–145)

## 2011-08-23 LAB — CBC
Hemoglobin: 9.7 g/dL — ABNORMAL LOW (ref 12.0–15.0)
RBC: 3.77 MIL/uL — ABNORMAL LOW (ref 3.87–5.11)

## 2011-08-24 LAB — COMPREHENSIVE METABOLIC PANEL
ALT: 5 U/L (ref 0–35)
Alkaline Phosphatase: 62 U/L (ref 39–117)
CO2: 16 mEq/L — ABNORMAL LOW (ref 19–32)
Calcium: 8.7 mg/dL (ref 8.4–10.5)
GFR calc Af Amer: >90 mL/min (ref >90)
GFR calc non Af Amer: >90 mL/min (ref >90)
Glucose, Bld: 87 mg/dL (ref 70–99)
Sodium: 137 mEq/L (ref 135–145)

## 2011-08-24 LAB — DIFFERENTIAL
Basophils Relative: 1 % (ref 0–1)
Lymphocytes Relative: 35 % (ref 12–46)
Lymphs Abs: 2.3 10*3/uL (ref 0.7–4.0)
Monocytes Relative: 8 % (ref 3–12)
Neutro Abs: 3.5 10*3/uL (ref 1.7–7.7)
Neutrophils Relative %: 53 % (ref 43–77)

## 2011-08-24 LAB — CBC
Hemoglobin: 11 g/dL — ABNORMAL LOW (ref 12.0–15.0)
MCH: 25.8 pg — ABNORMAL LOW (ref 26.0–34.0)
RBC: 4.27 MIL/uL (ref 3.87–5.11)

## 2011-08-25 LAB — DIFFERENTIAL
Basophils Absolute: 0 10*3/uL (ref 0.0–0.1)
Basophils Relative: 1 % (ref 0–1)
Eosinophils Absolute: 0.3 10*3/uL (ref 0.0–0.7)
Eosinophils Relative: 5 % (ref 0–5)
Monocytes Absolute: 0.5 10*3/uL (ref 0.1–1.0)
Neutro Abs: 2.8 10*3/uL (ref 1.7–7.7)

## 2011-08-25 LAB — COMPREHENSIVE METABOLIC PANEL
ALT: 6 U/L (ref 0–35)
Albumin: 2.8 g/dL — ABNORMAL LOW (ref 3.5–5.2)
Alkaline Phosphatase: 60 U/L (ref 39–117)
Potassium: 2.8 mEq/L — ABNORMAL LOW (ref 3.5–5.1)
Sodium: 137 mEq/L (ref 135–145)
Total Protein: 6 g/dL (ref 6.0–8.3)

## 2011-08-25 LAB — PREALBUMIN: Prealbumin: 10 mg/dL — ABNORMAL LOW (ref 17.0–34.0)

## 2011-08-25 LAB — TRIGLYCERIDES: Triglycerides: 129 mg/dL (ref <150)

## 2011-08-25 LAB — CBC
MCHC: 31.7 g/dL (ref 30.0–36.0)
Platelets: 400 10*3/uL (ref 150–400)
RDW: 15 % (ref 11.5–15.5)

## 2011-08-25 LAB — CHOLESTEROL, TOTAL: Cholesterol: 136 mg/dL (ref 0–200)
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
## 2011-08-26 ENCOUNTER — Inpatient Hospital Stay (HOSPITAL_COMMUNITY): Payer: Managed Care, Other (non HMO)

## 2011-08-26 LAB — CBC
Hemoglobin: 9.9 g/dL — ABNORMAL LOW (ref 12.0–15.0)
MCHC: 30.7 g/dL (ref 30.0–36.0)
Platelets: 383 10*3/uL (ref 150–400)
RBC: 3.94 MIL/uL (ref 3.87–5.11)

## 2011-08-26 LAB — COMPREHENSIVE METABOLIC PANEL
ALT: 5 U/L (ref 0–35)
AST: 12 U/L (ref 0–37)
Alkaline Phosphatase: 56 U/L (ref 39–117)
CO2: 24 mEq/L (ref 19–32)
GFR calc Af Amer: >90 mL/min (ref >90)
Glucose, Bld: 115 mg/dL — ABNORMAL HIGH (ref 70–99)
Potassium: 3.4 mEq/L — ABNORMAL LOW (ref 3.5–5.1)
Sodium: 136 mEq/L (ref 135–145)
Total Protein: 5.7 g/dL — ABNORMAL LOW (ref 6.0–8.3)

## 2011-08-26 LAB — GLUCOSE, CAPILLARY
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
## 2011-08-27 ENCOUNTER — Inpatient Hospital Stay (HOSPITAL_COMMUNITY): Payer: Managed Care, Other (non HMO)

## 2011-08-27 LAB — COMPREHENSIVE METABOLIC PANEL
AST: 16 U/L (ref 0–37)
Albumin: 2.4 g/dL — ABNORMAL LOW (ref 3.5–5.2)
BUN: 11 mg/dL (ref 6–23)
Calcium: 8.8 mg/dL (ref 8.4–10.5)
Creatinine, Ser: 0.59 mg/dL (ref 0.50–1.10)
Total Protein: 5.5 g/dL — ABNORMAL LOW (ref 6.0–8.3)

## 2011-08-27 LAB — TRIGLYCERIDES: Triglycerides: 65 mg/dL (ref <150)

## 2011-08-27 LAB — CBC
HCT: 29.9 % — ABNORMAL LOW (ref 36.0–46.0)
MCHC: 30.8 g/dL (ref 30.0–36.0)
MCV: 83.1 fL (ref 78.0–100.0)
Platelets: 355 10*3/uL (ref 150–400)
RDW: 15.6 % — ABNORMAL HIGH (ref 11.5–15.5)

## 2011-08-27 LAB — MAGNESIUM: Magnesium: 1.8 mg/dL (ref 1.5–2.5)

## 2011-08-27 LAB — DIFFERENTIAL
Eosinophils Absolute: 0.3 10*3/uL (ref 0.0–0.7)
Eosinophils Relative: 3 % (ref 0–5)
Lymphocytes Relative: 20 % (ref 12–46)
Lymphs Abs: 2.1 10*3/uL (ref 0.7–4.0)
Monocytes Absolute: 0.5 10*3/uL (ref 0.1–1.0)

## 2011-08-27 LAB — GLUCOSE, CAPILLARY
Glucose-Capillary: 100 mg/dL — ABNORMAL HIGH (ref 70–99)
Glucose-Capillary: 115 mg/dL — ABNORMAL HIGH (ref 70–99)

## 2011-08-27 LAB — PHOSPHORUS: Phosphorus: 2.4 mg/dL (ref 2.3–4.6)

## 2011-08-27 LAB — CHOLESTEROL, TOTAL: Cholesterol: 94 mg/dL (ref 0–200)

## 2011-08-28 LAB — GLUCOSE, RANDOM: Glucose, Bld: 110 mg/dL — ABNORMAL HIGH (ref 70–99)

## 2011-08-28 LAB — GLUCOSE, CAPILLARY

## 2011-08-28 LAB — COMPREHENSIVE METABOLIC PANEL
ALT: 6 U/L (ref 0–35)
Alkaline Phosphatase: 57 U/L (ref 39–117)
BUN: 10 mg/dL (ref 6–23)
CO2: 27 mEq/L (ref 19–32)
Calcium: 8.5 mg/dL (ref 8.4–10.5)
GFR calc Af Amer: >90 mL/min (ref >90)
GFR calc non Af Amer: >90 mL/min (ref >90)
Glucose, Bld: 294 mg/dL — ABNORMAL HIGH (ref 70–99)
Sodium: 138 mEq/L (ref 135–145)

## 2011-08-28 LAB — CBC
HCT: 28.1 % — ABNORMAL LOW (ref 36.0–46.0)
Hemoglobin: 8.6 g/dL — ABNORMAL LOW (ref 12.0–15.0)
MCH: 25.7 pg — ABNORMAL LOW (ref 26.0–34.0)
MCV: 84.1 fL (ref 78.0–100.0)
RBC: 3.34 MIL/uL — ABNORMAL LOW (ref 3.87–5.11)

## 2011-08-28 LAB — MAGNESIUM: Magnesium: 1.6 mg/dL (ref 1.5–2.5)

## 2011-08-28 NOTE — Op Note (Signed)
NAMECHRISTON, [name]     ACCOUNT NO.:  [id number]  MEDICAL RECORD NO.:  [id number]  LOCATION:                               FACILITY:  [facility]  PHYSICIAN:  [name], M.D.  DATE OF BIRTH:  [birth year]  DATE OF PROCEDURE:  08/27/2011                               OPERATIVE REPORT  PREOPERATIVE DIAGNOSIS:  Gastric obstruction at gastrojejunostomy.  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  PROCEDURE:  Esophagogastroscopy.  SURGEONS:  [name], M.D.  FIRST ASSISTANT:  None.  ANESTHESIA: 1. Fentanyl 75 mcg. 2. Versed 5 mg.  COMPLICATIONS:  None.  HISTORY:  This is a 30 year old white female who has moved to [place], [place], about 2 years ago.  Over the last 6 or 8 weeks, she has had progressive obstructive symptoms, is now admitted with what looks like a total gastric outlet obstruction.    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  She had an upper endoscopy by Dr. Wendall Ellison on the October 23rd, which he could not find outlet to her gastric pouch. She has been now treated medically for 6 days, and I thought it would be worthwhile trying to repeat the endoscopy.  I discussed with her the indications, potential complications of this procedure.  Potential complications include, but are not limited to, bleeding, infection, perforation of the bowel.  OPERATIVE NOTE:  The patient had the back of her throat anesthetized with Cetacaine. She was on O2 and was monitered with pulse oximetry, BP cuff, and cardiogram.   A time-out was held and surgical checklist run.    She was then rolled on left side.  She was given a total of 75 mcg of fentanyl, 5 mg of Versed.  I used a pediatric Pentax endoscope and I followed the NG tube she had into the esophagus and down to the stomach pouch.  I identified the esophagogastric  junction at 39 cm.  I could not find an outlet, though she has scarring at the base of the stomach where the gastrojejunostomy probaly is located.  The scar was at 45 cm from the incisors.  She has about a 5-6 cm pouch.  I took the biopsy forceps for the pediatric scope, which acted as a probe and tried to push this into the scar to see if I could find any kind of hole which would lead into the small bowel on the other side, but I was unsuccessful doing this, I did not biopsy the scar.  This had been biopsied by Dr. Christella Ellison before.  I took photos, which I put in the chart.  I removed the scope.  The NG tube was maybe 2 or 3 cm in the pouch.  She still has a lot of fluid in her pouch that I  irrigated out the pouch.  She tolerated the procedure well.  We will discuss with the family further options.  It looks like she is heading toward surgeon Dr. Johna Ellison on November 8th.   [name], M.D., FACS   DHN/MEDQ  D:  08/27/2011  T:  08/28/2011  Job:  [id number]  cc:  [name], M.D. [address], [address] [place] [address]  [name], M.D. [facility]  Electronically Signed by [name] M.D. on 08/28/2011 12:32:56 PM

## 2011-08-29 LAB — GLUCOSE, CAPILLARY
Glucose-Capillary: 107 mg/dL — ABNORMAL HIGH (ref 70–99)
Glucose-Capillary: 93 mg/dL (ref 70–99)

## 2011-08-29 LAB — BASIC METABOLIC PANEL
CO2: 29 mEq/L (ref 19–32)
Glucose, Bld: 103 mg/dL — ABNORMAL HIGH (ref 70–99)
Potassium: 3.7 mEq/L (ref 3.5–5.1)
Sodium: 138 mEq/L (ref 135–145)

## 2011-08-29 LAB — CBC
Hemoglobin: 8.8 g/dL — ABNORMAL LOW (ref 12.0–15.0)
RBC: 3.48 MIL/uL — ABNORMAL LOW (ref 3.87–5.11)

## 2011-08-30 ENCOUNTER — Encounter (INDEPENDENT_AMBULATORY_CARE_PROVIDER_SITE_OTHER): Payer: Self-pay | Admitting: Surgery

## 2011-08-30 LAB — GLUCOSE, CAPILLARY
Glucose-Capillary: 113 mg/dL — ABNORMAL HIGH (ref 70–99)
Glucose-Capillary: 119 mg/dL — ABNORMAL HIGH (ref 70–99)

## 2011-08-30 LAB — COMPREHENSIVE METABOLIC PANEL
ALT: 10 U/L (ref 0–35)
AST: 17 U/L (ref 0–37)
Alkaline Phosphatase: 68 U/L (ref 39–117)
CO2: 30 mEq/L (ref 19–32)
Calcium: 8.7 mg/dL (ref 8.4–10.5)
Chloride: 99 mEq/L (ref 96–112)
GFR calc Af Amer: >90 mL/min (ref >90)
GFR calc non Af Amer: >90 mL/min (ref >90)
Glucose, Bld: 109 mg/dL — ABNORMAL HIGH (ref 70–99)
Potassium: 3.7 mEq/L (ref 3.5–5.1)
Sodium: 135 mEq/L (ref 135–145)
Total Bilirubin: 0.1 mg/dL — ABNORMAL LOW (ref 0.3–1.2)
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
## 2011-08-31 LAB — BASIC METABOLIC PANEL
BUN: 13 mg/dL (ref 6–23)
Chloride: 98 mEq/L (ref 96–112)
Creatinine, Ser: 0.57 mg/dL (ref 0.50–1.10)
Glucose, Bld: 103 mg/dL — ABNORMAL HIGH (ref 70–99)
Potassium: 3.8 mEq/L (ref 3.5–5.1)

## 2011-08-31 LAB — GLUCOSE, CAPILLARY
Glucose-Capillary: 127 mg/dL — ABNORMAL HIGH (ref 70–99)
Glucose-Capillary: 125 mg/dL — ABNORMAL HIGH (ref 70–99)

## 2011-08-31 LAB — CBC
HCT: 27 % — ABNORMAL LOW (ref 36.0–46.0)
Hemoglobin: 8.3 g/dL — ABNORMAL LOW (ref 12.0–15.0)
MCV: 85.2 fL (ref 78.0–100.0)
RDW: 16.3 % — ABNORMAL HIGH (ref 11.5–15.5)
WBC: 7.7 10*3/uL (ref 4.0–10.5)

## 2011-08-31 LAB — MANGANESE: Manganese, Blood: 0.7 mcg/L (ref <1.2)

## 2011-08-31 MED ORDER — ONDANSETRON HCL 4 MG PO TABS: 4.0000 mg | ORAL_TABLET | ORAL | Status: DC | PRN

## 2011-08-31 MED ORDER — LEVOTHYROXINE SODIUM 100 MCG IV SOLR
37.5000 ug | Freq: Every day | INTRAVENOUS | Status: DC
  Administered 2011-09-02 – 2011-09-05 (×4): 38 ug via INTRAVENOUS
  Filled 2011-08-31 (×7): qty 1.9

## 2011-08-31 MED ORDER — SODIUM CHLORIDE 0.9 % IV BOLUS (SEPSIS): 250.0000 mL | INTRAVENOUS | Status: DC

## 2011-08-31 MED ORDER — MORPHINE SULFATE 2 MG/ML IJ SOLN: 1.0000 mg | INTRAMUSCULAR | Status: DC | PRN

## 2011-08-31 MED ORDER — LORAZEPAM 2 MG/ML IJ SOLN
1.0000 mg | INTRAMUSCULAR | Status: DC | PRN
  Administered 2011-09-02 – 2011-09-03 (×5): 2 mg via INTRAVENOUS
  Administered 2011-09-03: 1 mg via INTRAVENOUS
  Administered 2011-09-04 – 2011-09-05 (×10): 2 mg via INTRAVENOUS
  Administered 2011-09-06: 1 mg via INTRAVENOUS
  Filled 2011-08-31 (×4): qty 1

## 2011-08-31 MED ORDER — PROMETHAZINE HCL 25 MG/ML IJ SOLN: 12.5000 mg | Freq: Four times a day (QID) | INTRAMUSCULAR | Status: DC | PRN

## 2011-08-31 MED ORDER — SODIUM CHLORIDE 0.9 % IJ SOLN
10.0000 mL | INTRAMUSCULAR | Status: DC | PRN
  Administered 2011-09-06 (×2): 10 mL via INTRAVENOUS

## 2011-08-31 MED ORDER — MENTHOL 3 MG MT LOZG
1.0000 | LOZENGE | OROMUCOSAL | Status: DC | PRN
  Filled 2011-08-31: qty 9

## 2011-08-31 MED ORDER — BUPRENORPHINE HCL 0.3 MG/ML IJ SOLN
0.3000 mg | INTRAMUSCULAR | Status: DC | PRN
  Administered 2011-09-02 (×2): 0.3 mg via INTRAVENOUS

## 2011-08-31 MED ORDER — PANTOPRAZOLE SODIUM 40 MG IV SOLR
40.0000 mg | Freq: Two times a day (BID) | INTRAVENOUS | Status: DC
  Administered 2011-09-02 – 2011-09-05 (×8): 40 mg via INTRAVENOUS
  Filled 2011-08-31 (×16): qty 40

## 2011-08-31 MED ORDER — OXYCODONE HCL 5 MG PO TABS: 5.0000 mg | ORAL_TABLET | ORAL | Status: DC | PRN

## 2011-08-31 MED ORDER — LORAZEPAM 2 MG/ML IJ SOLN: 1.0000 mg | Freq: Four times a day (QID) | INTRAMUSCULAR | Status: DC | PRN

## 2011-08-31 MED ORDER — DEXTROSE-NACL 5-0.45 % IV SOLN
INTRAVENOUS | Status: DC
  Administered 2011-09-05: 06:00:00 via INTRAVENOUS

## 2011-08-31 MED ORDER — METOCLOPRAMIDE HCL 5 MG/ML IJ SOLN
10.0000 mg | Freq: Four times a day (QID) | INTRAMUSCULAR | Status: DC | PRN
Start: 2011-08-31 — End: 2011-09-06

## 2011-08-31 MED ORDER — PRAMIPEXOLE DIHYDROCHLORIDE 0.125 MG PO TABS
0.1250 mg | ORAL_TABLET | Freq: Every day | ORAL | Status: DC
  Filled 2011-08-31 (×8): qty 1

## 2011-08-31 MED ORDER — PHENOL 1.4 % MT LIQD: 1.0000 | Freq: Three times a day (TID) | OROMUCOSAL | Status: DC | PRN

## 2011-08-31 MED ORDER — SODIUM CHLORIDE 0.9 % IJ SOLN
3.0000 mL | Freq: Two times a day (BID) | INTRAMUSCULAR | Status: DC
  Administered 2011-09-02: 3 mL via INTRAVENOUS

## 2011-08-31 MED ORDER — ACETAMINOPHEN 325 MG PO TABS: 650.0000 mg | ORAL_TABLET | ORAL | Status: DC | PRN

## 2011-08-31 MED ORDER — ONDANSETRON HCL 4 MG/2ML IJ SOLN
4.0000 mg | INTRAMUSCULAR | Status: DC | PRN
  Administered 2011-09-03: 4 mg via INTRAVENOUS
  Filled 2011-08-31: qty 2

## 2011-08-31 MED ORDER — PROMETHAZINE HCL 25 MG/ML IJ SOLN
12.5000 mg | INTRAMUSCULAR | Status: DC | PRN
  Administered 2011-09-02 – 2011-09-05 (×15): 25 mg via INTRAVENOUS
  Administered 2011-09-05: 12.5 mg via INTRAVENOUS
  Administered 2011-09-05 – 2011-09-06 (×3): 25 mg via INTRAVENOUS
  Filled 2011-08-31 (×7): qty 1

## 2011-08-31 MED ORDER — MAGIC MOUTHWASH
5.0000 mL | Freq: Four times a day (QID) | ORAL | Status: DC | PRN
  Filled 2011-08-31: qty 5

## 2011-08-31 MED ORDER — DIPHENHYDRAMINE HCL 50 MG/ML IJ SOLN: 25.0000 mg | INTRAMUSCULAR | Status: DC | PRN

## 2011-08-31 MED ORDER — SODIUM CHLORIDE 0.9 % IJ SOLN
10.0000 mL | Freq: Two times a day (BID) | INTRAMUSCULAR | Status: DC
  Administered 2011-09-03 – 2011-09-05 (×4): 10 mL via INTRAVENOUS

## 2011-09-01 LAB — BASIC METABOLIC PANEL
Calcium: 8.6 mg/dL (ref 8.4–10.5)
Creatinine, Ser: 0.62 mg/dL (ref 0.50–1.10)
GFR calc Af Amer: >90 mL/min (ref >90)
GFR calc non Af Amer: >90 mL/min (ref >90)
Sodium: 136 mEq/L (ref 135–145)

## 2011-09-01 LAB — GLUCOSE, CAPILLARY
Glucose-Capillary: 107 mg/dL — ABNORMAL HIGH (ref 70–99)
Glucose-Capillary: 110 mg/dL — ABNORMAL HIGH (ref 70–99)
Glucose-Capillary: 97 mg/dL (ref 70–99)

## 2011-09-01 MED ORDER — THIAMINE HCL 100 MG/ML IJ SOLN
INTRAVENOUS | Status: AC
  Filled 2011-09-01: qty 2000

## 2011-09-01 MED ORDER — FAT EMULSION 20 % IV EMUL
250.0000 mL | INTRAVENOUS | Status: AC
  Filled 2011-09-01: qty 250

## 2011-09-01 MED ORDER — HYDROMORPHONE HCL PF 1 MG/ML IJ SOLN
1.0000 mg | INTRAMUSCULAR | Status: DC | PRN
  Administered 2011-09-02: 2 mg via INTRAVENOUS

## 2011-09-02 DIAGNOSIS — K311 Adult hypertrophic pyloric stenosis: Secondary | ICD-10-CM | POA: Diagnosis present

## 2011-09-02 DIAGNOSIS — F32A Depression, unspecified: Secondary | ICD-10-CM | POA: Diagnosis present

## 2011-09-02 DIAGNOSIS — F329 Major depressive disorder, single episode, unspecified: Secondary | ICD-10-CM | POA: Diagnosis present

## 2011-09-02 DIAGNOSIS — F419 Anxiety disorder, unspecified: Secondary | ICD-10-CM | POA: Diagnosis present

## 2011-09-02 LAB — GLUCOSE, CAPILLARY
Glucose-Capillary: 129 mg/dL — ABNORMAL HIGH (ref 70–99)
Glucose-Capillary: 90 mg/dL (ref 70–99)

## 2011-09-02 LAB — BASIC METABOLIC PANEL
BUN: 16 mg/dL (ref 6–23)
CO2: 31 mEq/L (ref 19–32)
Calcium: 9.8 mg/dL (ref 8.4–10.5)
GFR calc non Af Amer: >90 mL/min (ref >90)
Glucose, Bld: 110 mg/dL — ABNORMAL HIGH (ref 70–99)
Sodium: 135 mEq/L (ref 135–145)

## 2011-09-02 MED ORDER — PROMETHAZINE HCL 25 MG/ML IJ SOLN
INTRAMUSCULAR | Status: AC
  Administered 2011-09-02: 25 mg via INTRAVENOUS
  Filled 2011-09-02: qty 1

## 2011-09-02 MED ORDER — HYDROMORPHONE HCL PF 2 MG/ML IJ SOLN
INTRAMUSCULAR | Status: AC
  Administered 2011-09-02: 2 mg via INTRAVENOUS
  Filled 2011-09-02: qty 1

## 2011-09-02 MED ORDER — LORAZEPAM 2 MG/ML IJ SOLN
INTRAMUSCULAR | Status: AC
  Administered 2011-09-02: 2 mg via INTRAVENOUS
  Filled 2011-09-02: qty 1

## 2011-09-02 MED ORDER — BUPRENORPHINE HCL 0.3 MG/ML IJ SOLN
INTRAMUSCULAR | Status: AC
  Administered 2011-09-02: 0.3 mg via INTRAVENOUS
  Filled 2011-09-02: qty 1

## 2011-09-02 MED ORDER — FAT EMULSION 20 % IV EMUL
250.0000 mL | INTRAVENOUS | Status: AC
  Administered 2011-09-02: 250 mL via INTRAVENOUS
  Filled 2011-09-02: qty 250

## 2011-09-02 MED ORDER — HYDROMORPHONE HCL PF 2 MG/ML IJ SOLN
2.0000 mg | INTRAMUSCULAR | Status: DC | PRN
  Administered 2011-09-03 – 2011-09-05 (×18): 2 mg via INTRAVENOUS
  Administered 2011-09-05: 4 mg via INTRAVENOUS
  Administered 2011-09-05 (×2): 2 mg via INTRAVENOUS
  Administered 2011-09-06 (×3): 4 mg via INTRAVENOUS
  Filled 2011-09-02: qty 1
  Filled 2011-09-02: qty 2
  Filled 2011-09-02 (×2): qty 1
  Filled 2011-09-02: qty 2
  Filled 2011-09-02 (×2): qty 1
  Filled 2011-09-02: qty 2
  Filled 2011-09-02 (×3): qty 1
  Filled 2011-09-02: qty 2

## 2011-09-02 MED ORDER — HYDROMORPHONE HCL PF 2 MG/ML IJ SOLN: 2.0000 mg | INTRAMUSCULAR | Status: DC | PRN

## 2011-09-02 MED ORDER — THIAMINE HCL 100 MG/ML IJ SOLN
INTRAVENOUS | Status: AC
  Administered 2011-09-02: 17:00:00 via INTRAVENOUS
  Filled 2011-09-02: qty 2000

## 2011-09-02 MED ORDER — HYDROMORPHONE HCL PF 2 MG/ML IJ SOLN
INTRAMUSCULAR | Status: AC
  Administered 2011-09-02: 4 mg via INTRAVENOUS
  Filled 2011-09-02: qty 2

## 2011-09-02 MED ORDER — HYDROMORPHONE HCL PF 2 MG/ML IJ SOLN
INTRAMUSCULAR | Status: AC
  Filled 2011-09-02: qty 1

## 2011-09-02 MED ORDER — LORAZEPAM 2 MG/ML IJ SOLN
INTRAMUSCULAR | Status: AC
  Filled 2011-09-02: qty 1

## 2011-09-02 MED ORDER — HYDROMORPHONE HCL PF 2 MG/ML IJ SOLN
2.0000 mg | INTRAMUSCULAR | Status: DC | PRN
  Administered 2011-09-02: 4 mg via INTRAVENOUS

## 2011-09-02 MED ORDER — METOPROLOL TARTRATE 1 MG/ML IV SOLN
5.0000 mg | Freq: Two times a day (BID) | INTRAVENOUS | Status: DC
  Administered 2011-09-02 – 2011-09-05 (×6): 5 mg via INTRAVENOUS
  Filled 2011-09-02 (×12): qty 5

## 2011-09-02 MED ORDER — HYDROMORPHONE HCL PF 2 MG/ML IJ SOLN
4.0000 mg | Freq: Once | INTRAMUSCULAR | Status: AC
  Administered 2011-09-02: 4 mg via INTRAVENOUS

## 2011-09-02 MED ORDER — HYDROMORPHONE HCL PF 2 MG/ML IJ SOLN
INTRAMUSCULAR | Status: AC
  Filled 2011-09-02: qty 2

## 2011-09-02 MED ORDER — PROMETHAZINE HCL 25 MG/ML IJ SOLN
INTRAMUSCULAR | Status: AC
  Filled 2011-09-02: qty 1

## 2011-09-02 NOTE — Progress Notes (Signed)
     Subjective: [name] is uncomfortable, no other C/O  Objective: Vital signs in last 24 hours: Temp:  [98.1 F (36.7 C)-99.1 F (37.3 C)] 98.6 F (37 C) (11/04 0600) Pulse Rate:  [100-110] 110  (11/04 0600) Resp:  [18-20] 18  (11/04 0600) BP: (103-111)/(65-71) 111/67 mmHg (11/04 0600) SpO2:  [97 %-100 %] 100 % (11/04 0600) Weight:  [146 lb 9.7 oz (66.5 kg)] 146 lb 9.7 oz (66.5 kg) (11/04 0600)    Intake/Output from previous day: 11/03 0701 - 11/04 0700 In: -  Out: 900 [Urine:900] Intake/Output this shift:    General appearance: no distress Abdomen soft and non tender  Lab Results:   [facility] 08/31/11 0540  WBC 7.7  HGB 8.3*  HCT 27.0*  PLT 326   BMET  Basename 09/02/11 0500 09/01/11 0515  NA 135 136  K 4.0 3.8  CL 99 100  CO2 31 31  GLUCOSE 110* 86  BUN 16 13  CREATININE 0.62 0.62  CALCIUM 9.8 8.6   PT/INR No results found for this basename: LABPROT:2,INR:2 in the last 72 hours ABG No results found for this basename: PHART:2,PCO2:2,PO2:2,HCO3:2 in the last 72 hours  Studies/Results: No results found.  Anti-infectives: Anti-infectives    None      Assessment/Plan: Gastric outlet obstruction p RYGB Consider repeat EGD this week. Revision of gastrojejunostomy  11/8  if this is unsuccessful  LOS: 18 days    Londell Noll T 09/02/2011

## 2011-09-02 NOTE — Progress Notes (Signed)
  Subjective:  Patient reports feeling well but still with pain which is much better controlled on current pain medication.  Objective:  Vital signs in last 24 hours:  Filed Vitals:   09/01/11 1600 09/01/11 2000 09/02/11 0429 09/02/11 0600  BP: 104/65 103/71  111/67  Pulse: 100 108  110  Temp: 99 F (37.2 C) 98.1 F (36.7 C) 99.1 F (37.3 C) 98.6 F (37 C)  TempSrc: Oral Oral Oral Oral  Resp: 18 20  18   Height:      Weight:    66.5 kg (146 lb 9.7 oz)  SpO2: 99% 97%  100%    Intake/Output from previous day:   Intake/Output Summary (Last 24 hours) at 09/02/11 1256 Last data filed at 09/02/11 0700  Gross per 24 hour  Intake      0 ml  Output    900 ml  Net   -900 ml    Physical Exam: General: Alert, awake, oriented x3, in no acute distress. HEENT: No bruits, no goiter. Heart: Regular rate and rhythm, without murmurs, rubs, gallops. Lungs: Clear to auscultation bilaterally. Abdomen: Soft, epigastric tenderness, nondistended, positive bowel sounds. Extremities: No clubbing cyanosis or edema with positive pedal pulses. Neuro: Grossly intact, nonfocal.   Lab Results:  Basic Metabolic Panel:    Component Value Date/Time   NA 135 09/02/2011 0500   K 4.0 09/02/2011 0500   CL 99 09/02/2011 0500   CO2 31 09/02/2011 0500   BUN 16 09/02/2011 0500   CREATININE 0.62 09/02/2011 0500   GLUCOSE 110* 09/02/2011 0500   CALCIUM 9.8 09/02/2011 0500   CBC:    Component Value Date/Time   WBC 7.7 08/31/2011 0540   HGB 8.3* 08/31/2011 0540   HCT 27.0* 08/31/2011 0540   PLT 326 08/31/2011 0540   MCV 85.2 08/31/2011 0540   NEUTROABS 7.6 08/27/2011 0445   LYMPHSABS 2.1 08/27/2011 0445   MONOABS 0.5 08/27/2011 0445   EOSABS 0.3 08/27/2011 0445   BASOSABS 0.0 08/27/2011 0445    No results found for this or any previous visit (from the past 240 hour(s)).  Studies/Results: No results found.  Medications: Scheduled Meds:   . HYDROmorphone      . HYDROmorphone      . HYDROmorphone       . HYDROmorphone      . levothyroxine  38 mcg Intravenous QAC breakfast  . metoprolol  5 mg Intravenous Q12H  . pantoprazole (PROTONIX) IV  40 mg Intravenous Q12H  . pramipexole  0.125 mg Oral Daily  . promethazine      . sodium chloride  10 mL Intravenous Q12H  . sodium chloride  3 mL Intravenous Q12H   Continuous Infusions:   . dextrose 5 % and 0.45% NaCl    . TPN (CLINIMIX) +/- additives     And  . fat emulsion    . TPN (CLINIMIX) +/- additives     And  . fat emulsion    . sodium chloride     PRN Meds:.acetaminophen, buprenorphine, diphenhydrAMINE, HYDROmorphone (DILAUDID) injection, LORazepam, magic mouthwash, menthol-cetylpyridinium, metoCLOPramide (REGLAN) injection, morphine injection, ondansetron, ondansetron, oxyCODONE, phenol, promethazine, sodium chloride  Assessment/Plan:  Principal Problem:  *Complete gastric outlet obstruction - per surgery recommendations. Surgery is planned for 09/05/2011. For now continue supportive care with analgesics, antiemetics.  Active Problems:  Depression - continue home medication regimen   Anxiety - continue Ativan when necessary    LOS: 18 days   MAGICK-Naesha Buckalew 09/02/2011, 12:56 PM

## 2011-09-02 NOTE — Progress Notes (Signed)
PARENTERAL NUTRITION CONSULT NOTE - FOLLOW UP  Pharmacy Consult for TNA Indication: Gastric Outlet Obstruction  Allergies  Allergen Reactions  . Iodine Anaphylaxis  . Shellfish-Derived Products Anaphylaxis  . Adhesive (Tape) Other (See Comments)    Unknown reaction  . Contrast Media (Iodinated Diagnostic Agents) Nausea And Vomiting and Other (See Comments)    headache  . Latex Other (See Comments)    Plastic tape (unknown reaction)  . Topiramate (Topamax) Other (See Comments)    tremors  . Penicillins Rash    Patient Measurements: Height: 5\' 4"  (162.6 cm) (entered during cutover) Weight: 133 lb 13.1 oz (60.7 kg) (entered during cutover) IBW/kg (Calculated) : 54.7   Vital Signs: Temp: 99.1 F (37.3 C) (11/04 0429) Temp src: Oral (11/04 0429) BP: 103/71 mmHg (11/03 2000) Pulse Rate: 108  (11/03 2000)       Labs:  [facility] 08/31/11 0540  WBC 7.7  HGB 8.3*  HCT 27.0*  PLT 326  APTT --  INR --     Basename 09/02/11 0500 09/01/11 0515 08/31/11 0540  NA 135 136 134*  K 4.0 3.8 3.8  CL 99 100 98  CO2 31 31 31   GLUCOSE 110* 86 103*  BUN 16 13 13   CREATININE 0.62 0.62 0.57  LABCREA -- -- --  CREAT24HRUR -- -- --  CALCIUM 9.8 8.6 8.6  MG 2.1 1.6 1.7  PHOS -- -- 3.0  PROT -- -- --  ALBUMIN -- -- --  AST -- -- --  ALT -- -- --  ALKPHOS -- -- --  BILITOT -- -- --  BILIDIR -- -- --  IBILI -- -- --  PREALBUMIN -- -- --  CHOLHDL -- -- --  CHOL -- -- --   Estimated Creatinine Clearance: 88.8 ml/min (by C-G formula based on Cr of 0.62).    Basename 09/01/11 2004 09/01/11 1404 09/01/11 0810  GLUCAP 97 107* 110*    Medications:  Scheduled:    . HYDROmorphone      . HYDROmorphone      . HYDROmorphone      . levothyroxine  38 mcg Intravenous QAC breakfast  . pantoprazole (PROTONIX) IV  40 mg Intravenous Q12H  . pramipexole  0.125 mg Oral Daily  . promethazine      . promethazine      . sodium chloride  10 mL Intravenous Q12H  . sodium chloride  3 mL  Intravenous Q12H   Infusions:    . dextrose 5 % and 0.45% NaCl    .  TPN (CLINIMIX) +/- additives     And  . fat emulsion    . sodium chloride     PRN: acetaminophen, buprenorphine, diphenhydrAMINE, HYDROmorphone (DILAUDID) injection, LORazepam, magic mouthwash, menthol-cetylpyridinium, metoCLOPramide (REGLAN) injection, morphine injection, ondansetron, ondansetron, oxyCODONE, phenol, promethazine, sodium chloride     Current Nutrition:   NPO except ice chips.  Clinimix-E 5/15 at 80 mL/hr  Fat Emulsion 20% at 78mL/hr  TPN + Lipid providing 1843 kCal/day, 96 grams/protein day  Assessment:  Tolerating TPN + lipids at goal rate  Magnesium improved and wnl after 1 gram IV yesterday    Plan:   Continue present TPN and lipids formula and rate  Routine TNA labs tomorrow.  Ashlinn Hemrick, Brynda Greathouse K 09/02/2011,6:43 AM

## 2011-09-02 NOTE — Progress Notes (Signed)
I have just notified Dr. Izola Price to notify her that all of our current pharmacologic therapy (including Dilaudid 4 mg IV) is having no appreciable affect on her headache, which has consistently been 9-10/10 pain scale.  She placed an order for a one-time additional dose of Dilaudid 4 mg IV; which I have given.

## 2011-09-03 ENCOUNTER — Encounter (HOSPITAL_COMMUNITY): Payer: Self-pay | Admitting: *Deleted

## 2011-09-03 ENCOUNTER — Encounter (HOSPITAL_COMMUNITY)
Admission: EM | Disposition: A | Discharge status: Home / Self Care | Payer: Self-pay | Source: Home / Self Care | Attending: Internal Medicine

## 2011-09-03 ENCOUNTER — Encounter (HOSPITAL_COMMUNITY): Payer: Self-pay | Admitting: Anesthesiology

## 2011-09-03 HISTORY — PX: ESOPHAGOGASTRODUODENOSCOPY: SHX5428

## 2011-09-03 LAB — BASIC METABOLIC PANEL
BUN: 16 mg/dL (ref 6–23)
Chloride: 99 mEq/L (ref 96–112)
GFR calc Af Amer: >90 mL/min (ref >90)
Potassium: 3.8 mEq/L (ref 3.5–5.1)
Sodium: 135 mEq/L (ref 135–145)

## 2011-09-03 LAB — PREALBUMIN: Prealbumin: 15.8 mg/dL — ABNORMAL LOW (ref 17.0–34.0)

## 2011-09-03 LAB — GLUCOSE, CAPILLARY
Glucose-Capillary: 109 mg/dL — ABNORMAL HIGH (ref 70–99)
Glucose-Capillary: 102 mg/dL — ABNORMAL HIGH (ref 70–99)

## 2011-09-03 LAB — CBC
HCT: 27.9 % — ABNORMAL LOW (ref 36.0–46.0)
Platelets: 424 10*3/uL — ABNORMAL HIGH (ref 150–400)
RDW: 16.9 % — ABNORMAL HIGH (ref 11.5–15.5)
WBC: 4.5 10*3/uL (ref 4.0–10.5)

## 2011-09-03 LAB — MAGNESIUM: Magnesium: 1.7 mg/dL (ref 1.5–2.5)

## 2011-09-03 SURGERY — EGD (ESOPHAGOGASTRODUODENOSCOPY)
Anesthesia: Moderate Sedation

## 2011-09-03 MED ORDER — MIDAZOLAM HCL 10 MG/2ML IJ SOLN
INTRAMUSCULAR | Status: DC | PRN
  Administered 2011-09-03: 1 mg via INTRAVENOUS
  Administered 2011-09-03 (×2): 2 mg via INTRAVENOUS

## 2011-09-03 MED ORDER — FAT EMULSION 20 % IV EMUL
250.0000 mL | INTRAVENOUS | Status: AC
  Administered 2011-09-03: 250 mL via INTRAVENOUS
  Filled 2011-09-03: qty 250

## 2011-09-03 MED ORDER — HYDROMORPHONE HCL PF 2 MG/ML IJ SOLN
INTRAMUSCULAR | Status: AC
  Administered 2011-09-03: 2 mg via INTRAVENOUS
  Filled 2011-09-03: qty 1

## 2011-09-03 MED ORDER — LORAZEPAM 2 MG/ML IJ SOLN
INTRAMUSCULAR | Status: AC
  Administered 2011-09-03: 2 mg via INTRAVENOUS
  Filled 2011-09-03: qty 1

## 2011-09-03 MED ORDER — ACETAMINOPHEN 10 MG/ML IV SOLN
1000.0000 mg | Freq: Four times a day (QID) | INTRAVENOUS | Status: AC
  Administered 2011-09-03 – 2011-09-04 (×4): 1000 mg via INTRAVENOUS
  Filled 2011-09-03 (×7): qty 100

## 2011-09-03 MED ORDER — FENTANYL CITRATE 0.05 MG/ML IJ SOLN
INTRAMUSCULAR | Status: DC | PRN
  Administered 2011-09-03 (×2): 25 ug via INTRAVENOUS
  Administered 2011-09-03: 50 ug via INTRAVENOUS

## 2011-09-03 MED ORDER — PROMETHAZINE HCL 25 MG/ML IJ SOLN
INTRAMUSCULAR | Status: AC
  Administered 2011-09-03: 25 mg via INTRAVENOUS
  Filled 2011-09-03: qty 1

## 2011-09-03 MED ORDER — BUTAMBEN-TETRACAINE-BENZOCAINE 2-2-14 % EX AERO
INHALATION_SPRAY | CUTANEOUS | Status: DC | PRN
  Administered 2011-09-03: 2 via TOPICAL

## 2011-09-03 MED ORDER — TRACE MINERALS CR-CU-MN-SE-ZN 10-1000-500-60 MCG/ML IV SOLN
INTRAVENOUS | Status: AC
  Administered 2011-09-03: 18:00:00 via INTRAVENOUS
  Filled 2011-09-03: qty 2000

## 2011-09-03 NOTE — Plan of Care (Signed)
Problem: Phase I Progression Outcomes Goal: OOB as tolerated unless otherwise ordered Patient encouraged to ambulate in hall BID. Voices understanding. Goal: Voiding-avoid urinary catheter unless indicated met

## 2011-09-03 NOTE — Op Note (Signed)
PREOP DIAGNOSIS:  Gastric outlet obstruction, s/p RYGB  POSTOP DIAGNOSIS:   Gastric outlet obstruction, s/p RYGB  PROCEDURE:  Esophagogastroduedonoscopy  SURGEON:   [name], M.D.  ANESTHESIA:   Fentanyl  100 mcg   Versed 5 mg  INDICATIONS FOR PROCEDURE:  [name] is a 30 y.o. (DOB: 1981-03-03)  white female whose primary care physician is No primary provider on file. and comes for upper endoscopy to evaluate gastric outlet obstruction.  She had a laparoscopic Roux-en-Y gastric bypass in October 2005 and had done well over the intervening 7  years until her recent symtpoms.  She has now been hospitalized about 3 weeks, on  TPN, with NGT decompression and has seen no improvement.  She is tentatively scheduled for surgery this week by Dr. [name].  I am doing an endoscopy to see if there is any opening.   The indications and risks of the endoscopy were explained to the patient.  The risks include, but are not limited to, perforation, bleeding, or injury to the bowel.  PROCEDURE:  The patient was monitored with a pulse oximetry, BP cuff, and EKG.  The patient has nasal O2 flowing during the procedure.   The back of the throat was anesthestized with Ceticaine.  A flexible Pentax endoscope was passed down the throat without difficulty.  Findings include:   Esophagus:   Normal, NGT in position.  I followed this into the stomach.   GE junction at:  38 cm   Stomach: Pouch with no outlet.  Imagined outlet is at 45 cm.  I tried to probe this with biopsy forceps, but could not get into any opening.   Duodenum:   Not visualized.  PLAN:  Continue TPN and NGT.  For revisional surgery later this week by Dr. [name].

## 2011-09-03 NOTE — Progress Notes (Signed)
Subjective:  No events overnight. Pt still in pain.  Objective:  Vital signs in last 24 hours:  Filed Vitals:   09/03/11 1128 09/03/11 1136 09/03/11 1145 09/03/11 1156  BP: 108/66 101/67 102/69 103/64  Pulse: 108     Temp:      TempSrc:      Resp: 16 14 17 18   Height:      Weight:      SpO2: 100% 100% 97% 97%    Intake/Output from previous day:   Intake/Output Summary (Last 24 hours) at 09/03/11 1345 Last data filed at 09/03/11 1039  Gross per 24 hour  Intake   2090 ml  Output   2700 ml  Net   -610 ml    Physical Exam: General: Alert, awake, oriented x3, in no acute distress. HEENT: No bruits, no goiter. Heart: Regular rate and rhythm, without murmurs, rubs, gallops. Lungs: Clear to auscultation bilaterally. Abdomen: Soft, diffusely tender, nondistended, positive bowel sounds. Extremities: No clubbing cyanosis or edema with positive pedal pulses. Neuro: Grossly intact, nonfocal.   Lab Results:  Basic Metabolic Panel:    Component Value Date/Time   NA 135 09/03/2011 0537   K 3.8 09/03/2011 0537   CL 99 09/03/2011 0537   CO2 31 09/03/2011 0537   BUN 16 09/03/2011 0537   CREATININE 0.61 09/03/2011 0537   GLUCOSE 108* 09/03/2011 0537   CALCIUM 9.1 09/03/2011 0537   CBC:    Component Value Date/Time   WBC 4.5 09/03/2011 0537   HGB 8.1* 09/03/2011 0537   HCT 27.9* 09/03/2011 0537   PLT 424* 09/03/2011 0537   MCV 88.0 09/03/2011 0537   NEUTROABS 7.6 08/27/2011 0445   LYMPHSABS 2.1 08/27/2011 0445   MONOABS 0.5 08/27/2011 0445   EOSABS 0.3 08/27/2011 0445   BASOSABS 0.0 08/27/2011 0445    No results found for this or any previous visit (from the past 240 hour(s)).  Studies/Results: No results found.  Medications: Scheduled Meds:   . acetaminophen  1,000 mg Intravenous Q6H  . HYDROmorphone      . HYDROmorphone      . HYDROmorphone      . HYDROmorphone      . HYDROmorphone      . HYDROmorphone  4 mg Intravenous Once  . levothyroxine  38 mcg Intravenous  QAC breakfast  . LORazepam      . LORazepam      . metoprolol  5 mg Intravenous Q12H  . pantoprazole (PROTONIX) IV  40 mg Intravenous Q12H  . pramipexole  0.125 mg Oral Daily  . sodium chloride  10 mL Intravenous Q12H  . sodium chloride  3 mL Intravenous Q12H   Continuous Infusions:   . dextrose 5 % and 0.45% NaCl    . TPN (CLINIMIX) +/- additives     And  . fat emulsion    . TPN (CLINIMIX) +/- additives 80 mL/hr at 09/02/11 2203   And  . fat emulsion 250 mL (09/02/11 1726)  . TPN (CLINIMIX) +/- additives     And  . fat emulsion    . sodium chloride     PRN Meds:.buprenorphine, diphenhydrAMINE, HYDROmorphone (DILAUDID) injection, LORazepam, magic mouthwash, menthol-cetylpyridinium, metoCLOPramide (REGLAN) injection, ondansetron, ondansetron, oxyCODONE, phenol, promethazine, sodium chloride, DISCONTD: acetaminophen, DISCONTD: butamben-tetracaine-benzocaine, DISCONTD: fentaNYL, DISCONTD:  HYDROmorphone (DILAUDID) injection, DISCONTD:  HYDROmorphone (DILAUDID) injection DISCONTD:  HYDROmorphone (DILAUDID) injection, DISCONTD: midazolam, DISCONTD:  morphine injection  Assessment/Plan:  Principal Problem:  *Complete gastric outlet obstruction - per surgery, a fading surgery on  Wednesday, 09/05/2011. For now we'll continue supportive care with antiemetics, analgesics. Continue keeping n.p.o.  Active Problems:  Depression - continue home medication regimen   Anxiety - continue Ativan when necessary    LOS: 19 days   MAGICK-[name] 09/03/2011, 1:45 PM

## 2011-09-03 NOTE — Progress Notes (Signed)
Nutrition Follow Up  Diet: NPO  TNA: Clinimix 5/15 at 40ml/hr with lipids at 40ml/hr, provides 1843 calories, 96g protein, meeting ~100% calorie and protein needs  IVF: D5 1/2 NS at 58ml/hr - provides 143 calories  Medications reviewed. Labs reviewed. Alb stable at 2.4, awaiting PALB.   CBG (last 3)   Basename 09/03/11 0737 09/02/11 2203 09/02/11 0854  GLUCAP 109* 90 129*    CMP     Component Value Date/Time   NA 135 09/03/2011 0537   K 3.8 09/03/2011 0537   CL 99 09/03/2011 0537   CO2 31 09/03/2011 0537   GLUCOSE 108* 09/03/2011 0537   BUN 16 09/03/2011 0537   CREATININE 0.61 09/03/2011 0537   CALCIUM 9.1 09/03/2011 0537   PROT 5.8* 08/30/2011 0605   ALBUMIN 2.4* 08/30/2011 0605   AST 17 08/30/2011 0605   ALT 10 08/30/2011 0605   ALKPHOS 68 08/30/2011 0605   BILITOT 0.1* 08/30/2011 0605   GFRNONAA >90 09/03/2011 0537   GFRAA >90 09/03/2011 0537    Intake/Output Summary (Last 24 hours) at 09/03/11 1438 Last data filed at 09/03/11 1039  Gross per 24 hour  Intake   2090 ml  Output   2700 ml  Net   -610 ml   I/O last 3 completed shifts: In: 2090 [I.V.:410; NG/GT:600] Out: 1900 [Urine:900; Emesis/NG output:1000]  NG output: - 11/4  11/5 weight: 65.6kg Admit weight: 58.8kg - Noted almost 15 pound weight gain since admission, likely fluid related.   Nutrition Diagnosis - Inadequate oral intake - continues.  Goal - TNA to meet >90% of nutritional needs - met.  Plan - TNA per pharmacy. Noted MD plans for revision of gastrojejunostomy 11/8. EGD performed today - showed no opening at gastric outlet. No educational needs at this time. Will continue to monitor.  Dietitian # [phone]

## 2011-09-03 NOTE — Progress Notes (Signed)
PARENTERAL NUTRITION CONSULT NOTE - FOLLOW UP  Pharmacy Consult for TNA Indication: Gastric Outlet Obstruction  Allergies  Allergen Reactions  . Iodine Anaphylaxis  . Shellfish-Derived Products Anaphylaxis  . Adhesive (Tape) Other (See Comments)    Unknown reaction  . Contrast Media (Iodinated Diagnostic Agents) Nausea And Vomiting and Other (See Comments)    headache  . Latex Other (See Comments)    Plastic tape (unknown reaction)  . Topiramate (Topamax) Other (See Comments)    tremors  . Penicillins Rash    Patient Measurements: Height: 5\' 4"  (162.6 cm) Weight: 144 lb 10 oz (65.6 kg) IBW/kg (Calculated) : 54.7    Vital Signs: Temp: 98.8 F (37.1 C) (11/05 0943) Temp src: Oral (11/05 0943) BP: 110/71 mmHg (11/05 0943) Pulse Rate: 116  (11/05 0943) Intake/Output from previous day: 11/04 0701 - 11/05 0700 In: 2090 [I.V.:410; NG/GT:600; TPN:1080] Out: 1000 [Emesis/NG output:1000] Intake/Output from this shift: Total I/O In: -  Out: 1300 [Urine:1300]  Labs:  [facility] 09/03/11 0537  WBC 4.5  HGB 8.1*  HCT 27.9*  PLT 424*  APTT --  INR --     Basename 09/03/11 0537 09/02/11 0500 09/01/11 0515  NA 135 135 136  Ellison 3.8 4.0 3.8  CL 99 99 100  CO2 31 31 31   GLUCOSE 108* 110* 86  BUN 16 16 13   CREATININE 0.61 0.62 0.62  LABCREA -- -- --  CREAT24HRUR -- -- --  CALCIUM 9.1 9.8 8.6  MG 1.7 2.1 1.6  PHOS -- -- --  PROT -- -- --  ALBUMIN -- -- --  AST -- -- --  ALT -- -- --  ALKPHOS -- -- --  BILITOT -- -- --  BILIDIR -- -- --  IBILI -- -- --  PREALBUMIN -- -- --  CHOLHDL -- -- --  CHOL -- -- --   Estimated Creatinine Clearance: 88.8 ml/min (by C-G formula based on Cr of 0.61).    Basename 09/03/11 0737 09/02/11 2203 09/02/11 0854  GLUCAP 109* 90 129*    Medications: Metoprolol 5mg  IV q12h Levothyroxine 38 mcg IV daily Protonix 40mg  IV q12h Mirapex 0.125mg  PO daily Acetaminophen 1000mg  IV q6h  PRNs: hydromorphone, buprenorphine,  diphenhydramine, lorazepam, ondanseton, oxycodone, magic mouthwash, Cepacol lozenges,  Chloraseptic Spray, metoclopramide,      Insulin Requirements in the past 24 hours:  None  Current Nutrition:  NPO except ice chips.  Clinimix-E 5/15 at 80 mL/hr  Fat Emulsion 20% at 63mL/hr  TPN + Lipid providing 1843 kCal/day, 96 grams/protein day      Assessment:  Tolerating TPN + lipids at goal rate.   Plan:   Continue present TPN and lipids formula and rate  Follow routine TNA labs.  Follow-up on prealbumin result.  [name], [name] 09/03/2011,10:04 AM

## 2011-09-03 NOTE — Progress Notes (Signed)
Day of Surgery  Subjective: NG in good position. Denies abd pain. Does c/o headache, meds helping some.  Objective: Vital signs in last 24 hours: Temp:  [98.1 F (36.7 C)-98.9 F (37.2 C)] 98.5 F (36.9 C) (11/05 0600) Pulse Rate:  [89-111] 111  (11/05 0600) Resp:  [18-20] 18  (11/05 0600) BP: (94-109)/(58-74) 94/58 mmHg (11/05 0600) SpO2:  [95 %-98 %] 98 % (11/05 0600) Weight:  [144 lb 10 oz (65.6 kg)] 144 lb 10 oz (65.6 kg) (11/05 0600) Last BM Date: 09/01/11  Intake/Output this shift: Total I/O In: -  Out: 1300 [Urine:1300]  PE: Head: , AT Abd soft, ND.  Lab Results:   [facility] 09/03/11 0537  WBC 4.5  HGB 8.1*  HCT 27.9*  PLT 424*   BMET  Basename 09/03/11 0537 09/02/11 0500  NA 135 135  K 3.8 4.0  CL 99 99  CO2 31 31  GLUCOSE 108* 110*  BUN 16 16  CREATININE 0.61 0.62  CALCIUM 9.1 9.8   PT/INR No results found for this basename: LABPROT:2,INR:2 in the last 72 hours ABG No results found for this basename: PHART:2,PCO2:2,PO2:2,HCO3:2 in the last 72 hours  Studies/Results: No results found.  Anti-infectives: Anti-infectives    None      Assessment/Plan: s/p Procedure(s): ESOPHAGOGASTRODUODENOSCOPY (EGD) Principal Problem:  *Complete gastric outlet obstruction Active Problems:  Depression  Anxiety Headache  Repeat EGD today. Tentative plan for OR 11/8, cont TPN Change APAP to IV for headache.  LOS: 19 days    [name] 09/03/2011

## 2011-09-03 NOTE — Progress Notes (Signed)
Pt seen and examined.  Agree with KB's note. 

## 2011-09-04 LAB — GLUCOSE, CAPILLARY: Glucose-Capillary: 93 mg/dL (ref 70–99)

## 2011-09-04 LAB — TYPE AND SCREEN

## 2011-09-04 MED ORDER — FAT EMULSION 20 % IV EMUL
250.0000 mL | INTRAVENOUS | Status: AC
  Administered 2011-09-04: 250 mL via INTRAVENOUS
  Filled 2011-09-04: qty 250

## 2011-09-04 MED ORDER — THIAMINE HCL 100 MG/ML IJ SOLN
INTRAVENOUS | Status: AC
  Administered 2011-09-04: 18:00:00 via INTRAVENOUS
  Filled 2011-09-04: qty 2000

## 2011-09-04 NOTE — Progress Notes (Signed)
PARENTERAL NUTRITION CONSULT NOTE - FOLLOW UP  Pharmacy Consult for TNA Indication: Gastric Outlet Obstruction  Allergies  Allergen Reactions  . Iodine Anaphylaxis  . Shellfish-Derived Products Anaphylaxis  . Adhesive (Tape) Other (See Comments)    Unknown reaction  . Contrast Media (Iodinated Diagnostic Agents) Nausea And Vomiting and Other (See Comments)    headache  . Latex Other (See Comments)    Plastic tape (unknown reaction)  . Topiramate (Topamax) Other (See Comments)    tremors  . Penicillins Rash    Patient Measurements: Height: 5\' 4"  (162.6 cm) Weight: 149 lb 0.5 oz (67.6 kg) IBW/kg (Calculated) : 54.7    Vital Signs: Temp: 98 F (36.7 C) (11/06 0600) Temp src: Oral (11/06 0600) BP: 100/69 mmHg (11/06 0600) Pulse Rate: 88  (11/06 0600) Intake/Output from previous day: 11/05 0701 - 11/06 0700 In: 2988.9 [P.O.:720; I.V.:290; NG/GT:100; IV Piggyback:497.5; TPN:1381.4] Out: 1800 [Urine:1700; Emesis/NG output:100] Intake/Output from this shift:    Labs:  [facility] 09/03/11 0537  WBC 4.5  HGB 8.1*  HCT 27.9*  PLT 424*  APTT --  INR --     Basename 09/03/11 0537 09/02/11 0500  NA 135 135  K 3.8 4.0  CL 99 99  CO2 31 31  GLUCOSE 108* 110*  BUN 16 16  CREATININE 0.61 0.62  LABCREA -- --  CREAT24HRUR -- --  CALCIUM 9.1 9.8  MG 1.7 2.1  PHOS -- --  PROT -- --  ALBUMIN -- --  AST -- --  ALT -- --  ALKPHOS -- --  BILITOT -- --  BILIDIR -- --  IBILI -- --  PREALBUMIN 15.8* --  CHOLHDL -- --  CHOL -- --   Estimated Creatinine Clearance: 97.2 ml/min (by C-G formula based on Cr of 0.61).    Basename 09/04/11 0757 09/03/11 2219 09/03/11 1531  GLUCAP 109* 102* 112*    Medications:  Scheduled:    . acetaminophen  1,000 mg Intravenous Q6H  . levothyroxine  38 mcg Intravenous QAC breakfast  . metoprolol  5 mg Intravenous Q12H  . pantoprazole (PROTONIX) IV  40 mg Intravenous Q12H  . pramipexole  0.125 mg Oral Daily  . sodium chloride  10  mL Intravenous Q12H  . sodium chloride  3 mL Intravenous Q12H    Insulin Requirements in the past 24 hours:  None.  Current Nutrition:  NPO except ice chips.  Assessment: [name] tolerating TNA while NPO for gastric outlet obstruction.  Nutrition: receiving 100% of nutritional requirements (1843 kcal/day, 96g protein/day) with Clinimix E 5/15 at 80 ml/hr and Lipids at 10 ml/hr.  Pre-albumin 15.8 at baseline; will continue to monitor for progress in nutritional status with TNA.  Lytes: previously wnl  CBGs: controlled (<150 mg/dL)  Plan for OR 16/1.   Plan:  Continue Clinimix E 5/15 at 80 ml/hr and Lipids at 10 ml/hr.   [name] 09/04/2011,9:48 AM

## 2011-09-04 NOTE — Progress Notes (Signed)
  Subjective:  No events overnight. Patient still reports abdominal pain.  Objective:  Vital signs in last 24 hours:  Filed Vitals:   09/03/11 1402 09/03/11 2219 09/04/11 0500 09/04/11 0600  BP: 93/54 89/59  100/69  Pulse: 100 98  88  Temp: 98.7 F (37.1 C) 97.9 F (36.6 C)  98 F (36.7 C)  TempSrc: Oral Oral  Oral  Resp: 18 16  18   Height:      Weight:   67.6 kg (149 lb 0.5 oz)   SpO2: 96% 95%  98%    Intake/Output from previous day:   Intake/Output Summary (Last 24 hours) at 09/04/11 1345 Last data filed at 09/04/11 0900  Gross per 24 hour  Intake 2628.92 ml  Output    100 ml  Net 2528.92 ml    Physical Exam: General: Alert, awake, oriented x3, in no acute distress. HEENT: No bruits, no goiter. Heart: Regular rate and rhythm, without murmurs, rubs, gallops. Lungs: Clear to auscultation bilaterally. Abdomen: Soft, tender in epigastric area, nondistended, positive bowel sounds. Extremities: No clubbing cyanosis or edema with positive pedal pulses. Neuro: Grossly intact, nonfocal.   Lab Results:  Basic Metabolic Panel:    Component Value Date/Time   NA 135 09/03/2011 0537   K 3.8 09/03/2011 0537   CL 99 09/03/2011 0537   CO2 31 09/03/2011 0537   BUN 16 09/03/2011 0537   CREATININE 0.61 09/03/2011 0537   GLUCOSE 108* 09/03/2011 0537   CALCIUM 9.1 09/03/2011 0537   CBC:    Component Value Date/Time   WBC 4.5 09/03/2011 0537   HGB 8.1* 09/03/2011 0537   HCT 27.9* 09/03/2011 0537   PLT 424* 09/03/2011 0537   MCV 88.0 09/03/2011 0537   NEUTROABS 7.6 08/27/2011 0445   LYMPHSABS 2.1 08/27/2011 0445   MONOABS 0.5 08/27/2011 0445   EOSABS 0.3 08/27/2011 0445   BASOSABS 0.0 08/27/2011 0445    No results found for this or any previous visit (from the past 240 hour(s)).  Studies/Results: No results found.  Medications: Scheduled Meds:   . acetaminophen  1,000 mg Intravenous Q6H  . levothyroxine  38 mcg Intravenous QAC breakfast  . metoprolol  5 mg Intravenous  Q12H  . pantoprazole (PROTONIX) IV  40 mg Intravenous Q12H  . pramipexole  0.125 mg Oral Daily  . sodium chloride  10 mL Intravenous Q12H  . sodium chloride  3 mL Intravenous Q12H   Continuous Infusions:   . dextrose 5 % and 0.45% NaCl    . TPN (CLINIMIX) +/- additives 80 mL/hr at 09/03/11 1545   And  . fat emulsion 221.46 kcal (09/03/11 1545)  . TPN (CLINIMIX) +/- additives 80 mL/hr at 09/04/11 0600   And  . fat emulsion 250 mL (09/04/11 0600)  . TPN (CLINIMIX) +/- additives     And  . fat emulsion    . sodium chloride     PRN Meds:.buprenorphine, diphenhydrAMINE, HYDROmorphone (DILAUDID) injection, LORazepam, magic mouthwash, menthol-cetylpyridinium, metoCLOPramide (REGLAN) injection, ondansetron, ondansetron, oxyCODONE, phenol, promethazine, sodium chloride  Assessment/Plan:  Principal Problem:  *Complete gastric outlet obstruction - surgery pending for September 05, 2011. Continue supportive care with IV fluids, keeping n.p.o., antiemetics, analgesics.  Active Problems:  Depression - continue home medication regimen.   Anxiety - continue Ativan when necessary.    LOS: 20 days   MAGICK-Kiera Hussey 09/04/2011, 1:45 PM

## 2011-09-04 NOTE — Progress Notes (Signed)
Pt seen. Agree with KB's note. 

## 2011-09-04 NOTE — Progress Notes (Signed)
1 Day Post-Op  Subjective: No new c/o. Headache seems to be controlled with Dilaudid, Tylenol combo and is tolerable. EGD findings noted.  Objective: Vital signs in last 24 hours: Temp:  [97.9 F (36.6 C)-98.8 F (37.1 C)] 98 F (36.7 C) (11/06 0600) Pulse Rate:  [88-116] 88  (11/06 0600) Resp:  [13-26] 18  (11/06 0600) BP: (89-150)/(54-102) 100/69 mmHg (11/06 0600) SpO2:  [95 %-100 %] 98 % (11/06 0600) Weight:  [149 lb 0.5 oz (67.6 kg)] 149 lb 0.5 oz (67.6 kg) (11/06 0500) Last BM Date: 08/31/11  Intake/Output this shift:    Physical Exam: Abd soft, ND, NT  Lab Results:   [facility] 09/03/11 0537  WBC 4.5  HGB 8.1*  HCT 27.9*  PLT 424*   BMET  Basename 09/03/11 0537 09/02/11 0500  NA 135 135  K 3.8 4.0  CL 99 99  CO2 31 31  GLUCOSE 108* 110*  BUN 16 16  CREATININE 0.61 0.62  CALCIUM 9.1 9.8   PT/INR No results found for this basename: LABPROT:2,INR:2 in the last 72 hours ABG No results found for this basename: PHART:2,PCO2:2,PO2:2,HCO3:2 in the last 72 hours  Studies/Results: No results found.   Assessment/Plan: Principal Problem:  *Complete gastric outlet obstruction Active Problems:  Depression  Anxiety  ESOPHAGOGASTRODUODENOSCOPY (EGD)  No progress, continue TNA. Plan for OR 11-8 per Dr. [name].  LOS: 20 days    [name] 09/04/2011

## 2011-09-04 NOTE — Progress Notes (Signed)
[id number]/Breven Guidroz/Utilization review of Chart performed. 

## 2011-09-05 DIAGNOSIS — E039 Hypothyroidism, unspecified: Secondary | ICD-10-CM | POA: Diagnosis present

## 2011-09-05 DIAGNOSIS — G43909 Migraine, unspecified, not intractable, without status migrainosus: Secondary | ICD-10-CM | POA: Diagnosis present

## 2011-09-05 DIAGNOSIS — G2581 Restless legs syndrome: Secondary | ICD-10-CM | POA: Diagnosis present

## 2011-09-05 LAB — BASIC METABOLIC PANEL
BUN: 16 mg/dL (ref 6–23)
Chloride: 99 mEq/L (ref 96–112)
GFR calc Af Amer: >90 mL/min (ref >90)
GFR calc non Af Amer: >90 mL/min (ref >90)
Glucose, Bld: 101 mg/dL — ABNORMAL HIGH (ref 70–99)
Potassium: 3.9 mEq/L (ref 3.5–5.1)
Sodium: 135 mEq/L (ref 135–145)

## 2011-09-05 LAB — CBC
HCT: 27.9 % — ABNORMAL LOW (ref 36.0–46.0)
Hemoglobin: 8.3 g/dL — ABNORMAL LOW (ref 12.0–15.0)
WBC: 5.1 10*3/uL (ref 4.0–10.5)

## 2011-09-05 LAB — GLUCOSE, CAPILLARY: Glucose-Capillary: 138 mg/dL — ABNORMAL HIGH (ref 70–99)

## 2011-09-05 MED ORDER — CIPROFLOXACIN IN D5W 400 MG/200ML IV SOLN
400.0000 mg | INTRAVENOUS | Status: AC
  Administered 2011-09-06: 400 mg via INTRAVENOUS
  Filled 2011-09-05: qty 200

## 2011-09-05 MED ORDER — FAT EMULSION 20 % IV EMUL
250.0000 mL | INTRAVENOUS | Status: DC
  Administered 2011-09-05: 250 mL via INTRAVENOUS
  Filled 2011-09-05: qty 250

## 2011-09-05 MED ORDER — TRACE MINERALS CR-CU-MN-SE-ZN 10-1000-500-60 MCG/ML IV SOLN
INTRAVENOUS | Status: DC
  Administered 2011-09-05: 18:00:00 via INTRAVENOUS
  Filled 2011-09-05: qty 2000

## 2011-09-05 NOTE — Progress Notes (Signed)
Subjective: Patient still complaining of a headache.  Objective: Vital signs in last 24 hours: Filed Vitals:   09/04/11 2100 09/05/11 0500 09/05/11 0700 09/05/11 1400  BP: 104/66  106/64 102/62  Pulse: 114  103 103  Temp: 98.8 F (37.1 C)  98.4 F (36.9 C) 97.7 F (36.5 C)  TempSrc: Oral  Oral Oral  Resp: 20  20 20   Height:      Weight: 62.687 kg (138 lb 3.2 oz) 65.1 kg (143 lb 8.3 oz)    SpO2: 95%  97% 96%    Intake/Output Summary (Last 24 hours) at 09/05/11 1842 Last data filed at 09/05/11 1807  Gross per 24 hour  Intake   1510 ml  Output   1500 ml  Net     10 ml    Weight change: -4.913 kg (-10 lb 13.3 oz)  General: Alert, awake, oriented x3, in no acute distress. HEENT: No bruits, no goiter. Heart: Regular rate and rhythm, without murmurs, rubs, gallops. Lungs: Clear to auscultation bilaterally. Abdomen: Soft, nontender, nondistended, positive bowel sounds. Extremities: No clubbing cyanosis or edema with positive pedal pulses. Neuro: Grossly intact, nonfocal.   Lab Results:  Basename 09/05/11 0420 09/03/11 0537  NA 135 135  K 3.9 3.8  CL 99 99  CO2 31 31  GLUCOSE 101* 108*  BUN 16 16  CREATININE 0.63 0.61  CALCIUM 9.1 9.1  MG -- 1.7  PHOS -- --   No results found for this basename: AST:2,ALT:2,ALKPHOS:2,BILITOT:2,PROT:2,ALBUMIN:2 in the last 72 hours No results found for this basename: LIPASE:2,AMYLASE:2 in the last 72 hours  Basename 09/05/11 0420 09/03/11 0537  WBC 5.1 4.5  NEUTROABS -- --  HGB 8.3* 8.1*  HCT 27.9* 27.9*  MCV 87.5 88.0  PLT 432* 424*   No results found for this basename: CKTOTAL:3,CKMB:3,CKMBINDEX:3,TROPONINI:3 in the last 72 hours No results found for this basename: POCBNP:3 in the last 72 hours No results found for this basename: DDIMER:2 in the last 72 hours No results found for this basename: HGBA1C:2 in the last 72 hours No results found for this basename: CHOL:2,HDL:2,LDLCALC:2,TRIG:2,CHOLHDL:2,LDLDIRECT:2 in the last 72  hours No results found for this basename: TSH,T4TOTAL,FREET3,T3FREE,THYROIDAB in the last 72 hours No results found for this basename: VITAMINB12:2,FOLATE:2,FERRITIN:2,TIBC:2,IRON:2,RETICCTPCT:2 in the last 72 hours  Micro Results: No results found for this or any previous visit (from the past 240 hour(s)).  Studies/Results: No results found.  Medications:    . ciprofloxacin  400 mg Intravenous 120 min pre-op  . levothyroxine  38 mcg Intravenous QAC breakfast  . metoprolol  5 mg Intravenous Q12H  . pantoprazole (PROTONIX) IV  40 mg Intravenous Q12H  . pramipexole  0.125 mg Oral Daily  . sodium chloride  10 mL Intravenous Q12H  . sodium chloride  3 mL Intravenous Q12H    Assessment/Plan Principal Problem:  1.Complete gastric outlet obstruction-patient is scheduled for surgery tomorrow 09/06/1999 and continue TN,A continue NG tube, per general surgery. 2. Depression -Stable. Unable to place on medications secondary to #1.  3.Anxiety-Ativan as needed 4. Migraine-continue current pain regimen. Once patient is post surgery and on by mouth's will reconsult neurology for further evaluation and recommendations. 5. Hypothyroidism- Synthroid.  5.RLS (restless legs syndrome)-Mirapex.     LOS: 21 days   THOMPSON,DANIEL 09/05/2011, 6:42 PM

## 2011-09-05 NOTE — Progress Notes (Signed)
2 Days Post-Op  Subjective: No new c/o. Headache stable. Thin NG output, pink from chloraseptic.  Objective: Vital signs in last 24 hours: Temp:  [98.4 F (36.9 C)-98.8 F (37.1 C)] 98.4 F (36.9 C) (11/07 0700) Pulse Rate:  [103-114] 103  (11/07 0700) Resp:  [18-20] 20  (11/07 0700) BP: (103-106)/(64-75) 106/64 mmHg (11/07 0700) SpO2:  [95 %-97 %] 97 % (11/07 0700) Weight:  [138 lb 3.2 oz (62.687 kg)-143 lb 8.3 oz (65.1 kg)] 143 lb 8.3 oz (65.1 kg) (11/07 0500) Last BM Date: 08/31/11  Intake/Output this shift:    Physical Exam: Abdome soft, flat, NT  Lab Results:   [facility] 09/05/11 0420 09/03/11 0537  WBC 5.1 4.5  HGB 8.3* 8.1*  HCT 27.9* 27.9*  PLT 432* 424*   BMET  Basename 09/05/11 0420 09/03/11 0537  NA 135 135  K 3.9 3.8  CL 99 99  CO2 31 31  GLUCOSE 101* 108*  BUN 16 16  CREATININE 0.63 0.61  CALCIUM 9.1 9.1   PT/INR No results found for this basename: LABPROT:2,INR:2 in the last 72 hours ABG No results found for this basename: PHART:2,PCO2:2,PO2:2,HCO3:2 in the last 72 hours  Studies/Results: No results found.   Assessment/Plan: Principal Problem:  *Complete gastric outlet obstruction Active Problems:  Depression  Anxiety s/p Procedure(s): ESOPHAGOGASTRODUODENOSCOPY (EGD) Plan for OR discussed with pt by Dr. [name]. On schedule for OR tomorrow. Cont TNA, NPO. T&S done.  LOS: 21 days    Marianna Fuss 09/05/2011

## 2011-09-05 NOTE — Progress Notes (Signed)
PARENTERAL NUTRITION CONSULT NOTE - FOLLOW UP  Pharmacy Consult for TNA Indication: Gastric Outlet Obstruction  Allergies  Allergen Reactions  . Iodine Anaphylaxis  . Shellfish-Derived Products Anaphylaxis  . Adhesive (Tape) Other (See Comments)    Unknown reaction  . Contrast Media (Iodinated Diagnostic Agents) Nausea And Vomiting and Other (See Comments)    headache  . Latex Other (See Comments)    Plastic tape (unknown reaction)  . Topiramate (Topamax) Other (See Comments)    tremors  . Penicillins Rash    Patient Measurements: Height: 5\' 4"  (162.6 cm) Weight: 143 lb 8.3 oz (65.1 kg) IBW/kg (Calculated) : 54.7    Vital Signs: Temp: 98.4 F (36.9 C) (11/07 0700) Temp src: Oral (11/07 0700) BP: 106/64 mmHg (11/07 0700) Pulse Rate: 103  (11/07 0700) Intake/Output from previous day: 11/06 0701 - 11/07 0700 In: 1870 [I.V.:430; NG/GT:360; TPN:1080] Out: 1900 [Urine:800; Emesis/NG output:1100] Intake/Output from this shift:    Labs:  [facility] 09/05/11 0420 09/03/11 0537  WBC 5.1 4.5  HGB 8.3* 8.1*  HCT 27.9* 27.9*  PLT 432* 424*  APTT -- --  INR -- --     Basename 09/05/11 0420 09/03/11 0537  NA 135 135  K 3.9 3.8  CL 99 99  CO2 31 31  GLUCOSE 101* 108*  BUN 16 16  CREATININE 0.63 0.61  LABCREA -- --  CREAT24HRUR -- --  CALCIUM 9.1 9.1  MG -- 1.7  PHOS -- --  PROT -- --  ALBUMIN -- --  AST -- --  ALT -- --  ALKPHOS -- --  BILITOT -- --  BILIDIR -- --  IBILI -- --  PREALBUMIN -- 15.8*  CHOLHDL -- --  CHOL -- --   Estimated Creatinine Clearance: 88.8 ml/min (by C-G formula based on Cr of 0.63).    Basename 09/05/11 0721 09/04/11 1355 09/04/11 0757  GLUCAP 105* 93 109*    Medications:  Scheduled:     . ciprofloxacin  400 mg Intravenous 120 min pre-op  . levothyroxine  38 mcg Intravenous QAC breakfast  . metoprolol  5 mg Intravenous Q12H  . pantoprazole (PROTONIX) IV  40 mg Intravenous Q12H  . pramipexole  0.125 mg Oral Daily  .  sodium chloride  10 mL Intravenous Q12H  . sodium chloride  3 mL Intravenous Q12H    Insulin Requirements in the past 24 hours:  None.  Current Nutrition:  NPO.  Assessment: 29YOF tolerating TNA while NPO for gastric outlet obstruction.  Nutrition: receiving 100% of nutritional requirements (1843 kcal/day, 96g protein/day) with Clinimix E 5/15 at 80 ml/hr and Lipids at 10 ml/hr.  Will follow up TNA labs in AM.   Lytes: previously wnl  CBGs: controlled (<150 mg/dL)  Plan for OR 16/1.   Plan:  Continue Clinimix E 5/15 at 80 ml/hr and Lipids at 10 ml/hr. Will add MVI and Trace minerals to TNA today (provided every MWF only due to national shortage).   [name] 09/05/2011,8:43 AM

## 2011-09-05 NOTE — Progress Notes (Signed)
To OR tomorrow with Dr. [name].

## 2011-09-06 ENCOUNTER — Inpatient Hospital Stay (HOSPITAL_COMMUNITY): Payer: Managed Care, Other (non HMO) | Admitting: Certified Registered Nurse Anesthetist

## 2011-09-06 ENCOUNTER — Encounter (HOSPITAL_COMMUNITY): Payer: Self-pay | Admitting: Certified Registered Nurse Anesthetist

## 2011-09-06 ENCOUNTER — Encounter (HOSPITAL_COMMUNITY)
Admission: EM | Disposition: A | Discharge status: Home / Self Care | Payer: Self-pay | Source: Home / Self Care | Attending: Internal Medicine

## 2011-09-06 ENCOUNTER — Other Ambulatory Visit (INDEPENDENT_AMBULATORY_CARE_PROVIDER_SITE_OTHER): Payer: Self-pay | Admitting: General Surgery

## 2011-09-06 HISTORY — PX: GASTROJEJUNOSTOMY: SHX1697

## 2011-09-06 LAB — COMPREHENSIVE METABOLIC PANEL
ALT: 15 U/L (ref 0–35)
AST: 20 U/L (ref 0–37)
Alkaline Phosphatase: 84 U/L (ref 39–117)
CO2: 30 mEq/L (ref 19–32)
Calcium: 9 mg/dL (ref 8.4–10.5)
GFR calc Af Amer: >90 mL/min (ref >90)
Glucose, Bld: 109 mg/dL — ABNORMAL HIGH (ref 70–99)
Potassium: 3.6 mEq/L (ref 3.5–5.1)
Sodium: 137 mEq/L (ref 135–145)
Total Protein: 5.9 g/dL — ABNORMAL LOW (ref 6.0–8.3)

## 2011-09-06 LAB — CREATININE, SERUM
GFR calc Af Amer: >90 mL/min (ref >90)
GFR calc non Af Amer: >90 mL/min (ref >90)

## 2011-09-06 LAB — HEMOGLOBIN AND HEMATOCRIT, BLOOD: Hemoglobin: 8.1 g/dL — ABNORMAL LOW (ref 12.0–15.0)

## 2011-09-06 LAB — GLUCOSE, CAPILLARY: Glucose-Capillary: 106 mg/dL — ABNORMAL HIGH (ref 70–99)

## 2011-09-06 SURGERY — GASTROJEJUNOSTOMY, LAPAROSCOPIC
Anesthesia: General | Site: Abdomen | Wound class: Clean Contaminated

## 2011-09-06 MED ORDER — HYDROMORPHONE HCL PF 1 MG/ML IJ SOLN
0.2500 mg | INTRAMUSCULAR | Status: DC | PRN
  Administered 2011-09-06 (×2): 0.5 mg via INTRAVENOUS

## 2011-09-06 MED ORDER — SUCCINYLCHOLINE CHLORIDE 20 MG/ML IJ SOLN
INTRAMUSCULAR | Status: DC | PRN
  Administered 2011-09-06: 100 mg via INTRAVENOUS

## 2011-09-06 MED ORDER — BUPIVACAINE-EPINEPHRINE 0.25% -1:200000 IJ SOLN
INTRAMUSCULAR | Status: DC | PRN
  Administered 2011-09-06: 50 mL

## 2011-09-06 MED ORDER — FENTANYL CITRATE 0.05 MG/ML IJ SOLN
INTRAMUSCULAR | Status: DC | PRN
  Administered 2011-09-06 (×7): 50 ug via INTRAVENOUS

## 2011-09-06 MED ORDER — GLYCOPYRROLATE 0.2 MG/ML IJ SOLN
INTRAMUSCULAR | Status: DC | PRN
  Administered 2011-09-06: .5 mg via INTRAVENOUS

## 2011-09-06 MED ORDER — THIAMINE HCL 100 MG/ML IJ SOLN
Freq: Once | INTRAVENOUS | Status: AC
  Administered 2011-09-06: 20:00:00 via INTRAVENOUS

## 2011-09-06 MED ORDER — DEXAMETHASONE SODIUM PHOSPHATE 10 MG/ML IJ SOLN
INTRAMUSCULAR | Status: DC | PRN
  Administered 2011-09-06: 10 mg via INTRAVENOUS

## 2011-09-06 MED ORDER — FAT EMULSION (SMOFLIPID) 20 % NICU SYRINGE
INTRAVENOUS | Status: DC | PRN
  Administered 2011-09-06: 10 mL/h via INTRAVENOUS

## 2011-09-06 MED ORDER — HEPARIN SODIUM (PORCINE) 5000 UNIT/ML IJ SOLN
5000.0000 [IU] | Freq: Three times a day (TID) | INTRAMUSCULAR | Status: DC
  Administered 2011-09-07 – 2011-09-11 (×13): 5000 [IU] via SUBCUTANEOUS
  Filled 2011-09-06 (×16): qty 1

## 2011-09-06 MED ORDER — HETASTARCH-ELECTROLYTES 6 % IV SOLN
INTRAVENOUS | Status: DC | PRN
  Administered 2011-09-06: 10:00:00 via INTRAVENOUS

## 2011-09-06 MED ORDER — MORPHINE SULFATE 4 MG/ML IJ SOLN
INTRAMUSCULAR | Status: AC
  Administered 2011-09-06: 23:00:00
  Filled 2011-09-06: qty 1

## 2011-09-06 MED ORDER — TRACE MINERALS CR-CU-MN-SE-ZN 10-1000-500-60 MCG/ML IV SOLN
INTRAVENOUS | Status: DC | PRN
  Administered 2011-09-06: 80 mL/h via INTRAVENOUS

## 2011-09-06 MED ORDER — FIBRIN SEALANT COMPONENT 5 ML EX KIT
PACK | CUTANEOUS | Status: DC | PRN
  Administered 2011-09-06: 10 mL

## 2011-09-06 MED ORDER — FAT EMULSION 20 % IV EMUL
250.0000 mL | INTRAVENOUS | Status: DC
  Administered 2011-09-06: 250 mL via INTRAVENOUS
  Filled 2011-09-06: qty 250

## 2011-09-06 MED ORDER — OXYCODONE-ACETAMINOPHEN 5-325 MG/5ML PO SOLN
5.0000 mL | ORAL | Status: DC | PRN
  Administered 2011-09-08 (×2): 5 mL via ORAL
  Administered 2011-09-09 – 2011-09-11 (×5): 10 mL via ORAL
  Filled 2011-09-06 (×5): qty 10
  Filled 2011-09-06 (×2): qty 5
  Filled 2011-09-06: qty 10

## 2011-09-06 MED ORDER — PROMETHAZINE HCL 25 MG/ML IJ SOLN
6.2500 mg | INTRAMUSCULAR | Status: AC | PRN
  Administered 2011-09-06: 12.5 mg via INTRAVENOUS
  Administered 2011-09-07: 12.4 mg via INTRAVENOUS
  Filled 2011-09-06: qty 1

## 2011-09-06 MED ORDER — ONDANSETRON HCL 4 MG/2ML IJ SOLN
4.0000 mg | INTRAMUSCULAR | Status: DC | PRN
  Administered 2011-09-07: 4 mg via INTRAVENOUS

## 2011-09-06 MED ORDER — ACETAMINOPHEN 160 MG/5ML PO SOLN: 650.0000 mg | ORAL | Status: DC | PRN

## 2011-09-06 MED ORDER — LABETALOL HCL 5 MG/ML IV SOLN
INTRAVENOUS | Status: DC | PRN
  Administered 2011-09-06: 2.5 mg via INTRAVENOUS

## 2011-09-06 MED ORDER — ONDANSETRON HCL 4 MG/2ML IJ SOLN
INTRAMUSCULAR | Status: DC | PRN
  Administered 2011-09-06: 4 mg via INTRAVENOUS

## 2011-09-06 MED ORDER — LIDOCAINE HCL (CARDIAC) 20 MG/ML IV SOLN
INTRAVENOUS | Status: DC | PRN
  Administered 2011-09-06: 60 mg via INTRAVENOUS

## 2011-09-06 MED ORDER — PROPOFOL 10 MG/ML IV EMUL
INTRAVENOUS | Status: DC | PRN
  Administered 2011-09-06: 150 mg via INTRAVENOUS

## 2011-09-06 MED ORDER — NEOSTIGMINE METHYLSULFATE 1 MG/ML IJ SOLN
INTRAMUSCULAR | Status: DC | PRN
  Administered 2011-09-06: 4 mg via INTRAVENOUS

## 2011-09-06 MED ORDER — LACTATED RINGERS IV SOLN
INTRAVENOUS | Status: DC | PRN
  Administered 2011-09-06 (×2): via INTRAVENOUS

## 2011-09-06 MED ORDER — THIAMINE HCL 100 MG/ML IJ SOLN
INTRAVENOUS | Status: DC
  Administered 2011-09-06: 18:00:00 via INTRAVENOUS
  Filled 2011-09-06: qty 2000

## 2011-09-06 MED ORDER — MORPHINE SULFATE 2 MG/ML IJ SOLN
2.0000 mg | INTRAMUSCULAR | Status: DC | PRN
  Administered 2011-09-06: 2 mg via INTRAVENOUS
  Administered 2011-09-06: 4 mg via INTRAVENOUS
  Administered 2011-09-06 – 2011-09-07 (×6): 2 mg via INTRAVENOUS
  Administered 2011-09-08: 4 mg via INTRAVENOUS
  Filled 2011-09-06 (×4): qty 1
  Filled 2011-09-06: qty 2
  Filled 2011-09-06: qty 1

## 2011-09-06 MED ORDER — MIDAZOLAM HCL 5 MG/5ML IJ SOLN
INTRAMUSCULAR | Status: DC | PRN
  Administered 2011-09-06: 2 mg via INTRAVENOUS

## 2011-09-06 MED ORDER — FAT EMULSION 20 % IV EMUL
250.0000 mL | Freq: Once | INTRAVENOUS | Status: AC
  Administered 2011-09-06: 250 mL via INTRAVENOUS

## 2011-09-06 MED ORDER — ROCURONIUM BROMIDE 100 MG/10ML IV SOLN
INTRAVENOUS | Status: DC | PRN
  Administered 2011-09-06: 10 mg via INTRAVENOUS
  Administered 2011-09-06: 40 mg via INTRAVENOUS
  Administered 2011-09-06 (×2): 10 mg via INTRAVENOUS

## 2011-09-06 SURGICAL SUPPLY — 97 items
APPLICATOR COTTON TIP 6IN STRL (MISCELLANEOUS) IMPLANT
APPLIER CLIP ROT 10 11.4 M/L (STAPLE)
APPLIER CLIP ROT 13.4 12 LRG (CLIP)
BENZOIN TINCTURE PRP APPL 2/3 (GAUZE/BANDAGES/DRESSINGS) IMPLANT
BLADE HEX COATED 2.75 (ELECTRODE) IMPLANT
BLADE SURG 15 STRL LF DISP TIS (BLADE) IMPLANT
BLADE SURG 15 STRL SS (BLADE)
BLADE SURG SZ11 CARB STEEL (BLADE) IMPLANT
CABLE HIGH FREQUENCY MONO STRZ (ELECTRODE) ×2 IMPLANT
CANISTER SUCTION 2500CC (MISCELLANEOUS) ×2 IMPLANT
CANNULA ENDOPATH XCEL 11M (ENDOMECHANICALS) IMPLANT
CLAMP ENDO BABCK 10MM (STAPLE) IMPLANT
CLIP APPLIE ROT 10 11.4 M/L (STAPLE) IMPLANT
CLIP APPLIE ROT 13.4 12 LRG (CLIP) IMPLANT
CLIP SUT LAPRA TY ABSORB (SUTURE) ×2 IMPLANT
CLOTH BEACON ORANGE TIMEOUT ST (SAFETY) ×2 IMPLANT
COVER MAYO STAND STRL (DRAPES) IMPLANT
COVER SURGICAL LIGHT HANDLE (MISCELLANEOUS) ×2 IMPLANT
CUTTER LINEAR ENDO ART 45 ETS (STAPLE) ×2 IMPLANT
DERMABOND ADVANCED (GAUZE/BANDAGES/DRESSINGS) ×1
DERMABOND ADVANCED .7 DNX12 (GAUZE/BANDAGES/DRESSINGS) ×1 IMPLANT
DEVICE SUTURE ENDOST 10MM (ENDOMECHANICALS) ×2 IMPLANT
DISSECTOR BLUNT TIP ENDO 5MM (MISCELLANEOUS) IMPLANT
DRAIN PENROSE 18X1/4 LTX STRL (WOUND CARE) IMPLANT
DRAPE CAMERA CLOSED 9X96 (DRAPES) ×2 IMPLANT
DRAPE LAPAROSCOPIC ABDOMINAL (DRAPES) ×2 IMPLANT
DRAPE WARM FLUID 44X44 (DRAPE) ×2 IMPLANT
ELECT REM PT RETURN 9FT ADLT (ELECTROSURGICAL) ×4
ELECTRODE REM PT RTRN 9FT ADLT (ELECTROSURGICAL) ×2 IMPLANT
ENDOLOOP SUT PDS II  0 18 (SUTURE)
ENDOLOOP SUT PDS II 0 18 (SUTURE) IMPLANT
GAUZE SPONGE 4X4 16PLY XRAY LF (GAUZE/BANDAGES/DRESSINGS) ×2 IMPLANT
GLOVE BIOGEL PI IND STRL 7.0 (GLOVE) ×2 IMPLANT
GLOVE BIOGEL PI INDICATOR 7.0 (GLOVE) ×2
GOWN STRL NON-REIN LRG LVL3 (GOWN DISPOSABLE) ×4 IMPLANT
GOWN STRL REIN XL XLG (GOWN DISPOSABLE) ×8 IMPLANT
HEMOSTAT SURGICEL 4X8 (HEMOSTASIS) IMPLANT
HOVERMATT SINGLE USE (MISCELLANEOUS) IMPLANT
KIT BASIN OR (CUSTOM PROCEDURE TRAY) ×2 IMPLANT
KIT GASTRIC LAVAGE 34FR ADT (SET/KITS/TRAYS/PACK) ×2 IMPLANT
LIGASURE IMPACT 36 18CM CVD LR (INSTRUMENTS) IMPLANT
NEEDLE SPNL 22GX3.5 QUINCKE BK (NEEDLE) ×2 IMPLANT
NS IRRIG 1000ML POUR BTL (IV SOLUTION) ×2 IMPLANT
PACK CARDIOVASCULAR III (CUSTOM PROCEDURE TRAY) ×2 IMPLANT
PEN SKIN MARKING BROAD (MISCELLANEOUS) ×2 IMPLANT
PENCIL BUTTON HOLSTER BLD 10FT (ELECTRODE) IMPLANT
POUCH SPECIMEN RETRIEVAL 10MM (ENDOMECHANICALS) ×2 IMPLANT
RELOAD 45 VASCULAR/THIN (ENDOMECHANICALS) IMPLANT
RELOAD BLUE (STAPLE) ×2 IMPLANT
RELOAD ENDO STITCH 2.0 (ENDOMECHANICALS) ×3
RELOAD GOLD (STAPLE) ×6 IMPLANT
RELOAD STAPLE TA45 3.5 REG BLU (ENDOMECHANICALS) ×4 IMPLANT
RELOAD WHITE ECR60W (STAPLE) IMPLANT
SCALPEL HARMONIC ACE (MISCELLANEOUS) ×2 IMPLANT
SCISSORS LAP 5X35 DISP (ENDOMECHANICALS) ×2 IMPLANT
SEALANT SURGICAL APPL DUAL CAN (MISCELLANEOUS) ×2 IMPLANT
SET IRRIG TUBING LAPAROSCOPIC (IRRIGATION / IRRIGATOR) ×4 IMPLANT
SLEEVE ADV FIXATION 12X100MM (TROCAR) ×4 IMPLANT
SLEEVE ADV FIXATION 5X100MM (TROCAR) ×2 IMPLANT
SOLUTION ANTI FOG 6CC (MISCELLANEOUS) ×2 IMPLANT
SPONGE GAUZE 4X4 12PLY (GAUZE/BANDAGES/DRESSINGS) ×4 IMPLANT
SPONGE LAP 18X18 X RAY DECT (DISPOSABLE) ×2 IMPLANT
STAPLE ECHEON FLEX 60 POW ENDO (STAPLE) ×2 IMPLANT
STAPLER STANDARD HANDLE (STAPLE) IMPLANT
STAPLER VISISTAT 35W (STAPLE) ×2 IMPLANT
STRIP CLOSURE SKIN 1/2X4 (GAUZE/BANDAGES/DRESSINGS) IMPLANT
SUT ETHILON 3 0 PS 1 (SUTURE) IMPLANT
SUT MNCRL AB 4-0 PS2 18 (SUTURE) ×2 IMPLANT
SUT RELOAD ENDO STITCH 2 48X1 (ENDOMECHANICALS) ×3
SUT RELOAD ENDO STITCH 2.0 (ENDOMECHANICALS)
SUT SILK 2 0 (SUTURE) ×1
SUT SILK 2 0 SH CR/8 (SUTURE) ×2 IMPLANT
SUT SILK 2-0 18XBRD TIE 12 (SUTURE) ×1 IMPLANT
SUT SILK 3 0 (SUTURE)
SUT SILK 3 0 SH CR/8 (SUTURE) IMPLANT
SUT SILK 3-0 18XBRD TIE 12 (SUTURE) IMPLANT
SUT VIC AB 2-0 SH 27 (SUTURE) ×1
SUT VIC AB 2-0 SH 27X BRD (SUTURE) ×1 IMPLANT
SUTURE RELOAD END STTCH 2 48X1 (ENDOMECHANICALS) ×3 IMPLANT
SUTURE RELOAD ENDO STITCH 2.0 (ENDOMECHANICALS) IMPLANT
SYR 20CC LL (SYRINGE) ×2 IMPLANT
SYR 50ML LL SCALE MARK (SYRINGE) ×2 IMPLANT
SYR CONTROL 10ML LL (SYRINGE) ×2 IMPLANT
TOWEL OR 17X26 10 PK STRL BLUE (TOWEL DISPOSABLE) ×6 IMPLANT
TRAY FOLEY CATH 14FRSI W/METER (CATHETERS) ×2 IMPLANT
TRAY LAP CHOLE (CUSTOM PROCEDURE TRAY) ×2 IMPLANT
TROCAR ADV FIXATION 12X100MM (TROCAR) ×2 IMPLANT
TROCAR BLADELESS OPT 5 75 (ENDOMECHANICALS) ×2 IMPLANT
TROCAR XCEL 12X100 BLDLESS (ENDOMECHANICALS) ×2 IMPLANT
TROCAR XCEL BLUNT TIP 100MML (ENDOMECHANICALS) ×2 IMPLANT
TROCAR XCEL NON-BLD 11X100MML (ENDOMECHANICALS) IMPLANT
TROCAR Z-THREAD FIOS 5X100MM (TROCAR) ×2 IMPLANT
TUBING ENDO SMARTCAP (MISCELLANEOUS) ×2 IMPLANT
TUBING FILTER THERMOFLATOR (ELECTROSURGICAL) ×2 IMPLANT
TUBING INSUFFLATION 10FT LAP (TUBING) ×2 IMPLANT
WATER STERILE IRR 1500ML POUR (IV SOLUTION) ×2 IMPLANT
YANKAUER SUCT BULB TIP 10FT TU (MISCELLANEOUS) ×2 IMPLANT

## 2011-09-06 NOTE — Anesthesia Postprocedure Evaluation (Signed)
  Anesthesia Post-op Note  Patient: [name]  Procedure(s) Performed:  LAPAROSCOPIC GASTROJEJUNOSTOMY  Patient Location: PACU  Anesthesia Type: General  Level of Consciousness: awake and alert   Airway and Oxygen Therapy: Patient Spontanous Breathing  Post-op Pain: mild  Post-op Assessment: Post-op Vital signs reviewed, Patient's Cardiovascular Status Stable, Respiratory Function Stable, Patent Airway and No signs of Nausea or vomiting  Post-op Vital Signs: stable  Complications: No apparent anesthesia complications

## 2011-09-06 NOTE — Progress Notes (Signed)
PARENTERAL NUTRITION CONSULT NOTE - FOLLOW UP  Pharmacy Consult for TNA Indication: Gastric Outlet Obstruction  Allergies  Allergen Reactions  . Iodine Anaphylaxis  . Shellfish-Derived Products Anaphylaxis  . Adhesive (Tape) Other (See Comments)    Unknown reaction  . Contrast Media (Iodinated Diagnostic Agents) Nausea And Vomiting and Other (See Comments)    headache  . Latex Other (See Comments)    Plastic tape (unknown reaction)  . Topiramate (Topamax) Other (See Comments)    tremors  . Penicillins Rash    Patient Measurements: Height: 5\' 4"  (162.6 cm) Weight: 143 lb 8.3 oz (65.1 kg) IBW/kg (Calculated) : 54.7    Vital Signs: Temp: 98.4 F (36.9 C) (11/08 0535) Temp src: Oral (11/08 0535) BP: 100/67 mmHg (11/08 0535) Pulse Rate: 101  (11/08 0535) Intake/Output from previous day: 11/07 0701 - 11/08 0700 In: 3481.3 [I.V.:840; NG/GT:500; TPN:2141.3] Out: 1100 [Urine:1100] Intake/Output from this shift:    Labs:  [facility] 09/05/11 0420  WBC 5.1  HGB 8.3*  HCT 27.9*  PLT 432*  APTT --  INR --     [facility] 09/06/11 0647 09/05/11 0420  NA 137 135  K 3.6 3.9  CL 101 99  CO2 30 31  GLUCOSE 109* 101*  BUN 16 16  CREATININE 0.62 0.63  LABCREA -- --  CREAT24HRUR -- --  CALCIUM 9.0 9.1  MG 1.7 --  PHOS 4.0 --  PROT 5.9* --  ALBUMIN 2.3* --  AST 20 --  ALT 15 --  ALKPHOS 84 --  BILITOT 0.1* --  BILIDIR -- --  IBILI -- --  PREALBUMIN -- --  CHOLHDL -- --  CHOL -- --   Estimated Creatinine Clearance: 88.8 ml/min (by C-G formula based on Cr of 0.62).    Basename 09/06/11 0729 09/05/11 2323 09/05/11 1404  GLUCAP 101* 106* 138*    Medications:  Scheduled:     . ciprofloxacin  400 mg Intravenous 120 min pre-op  . levothyroxine  38 mcg Intravenous QAC breakfast  . metoprolol  5 mg Intravenous Q12H  . pantoprazole (PROTONIX) IV  40 mg Intravenous Q12H  . pramipexole  0.125 mg Oral Daily  . sodium chloride  10 mL Intravenous Q12H  . sodium  chloride  3 mL Intravenous Q12H    Insulin Requirements in the past 24 hours:  None.  Current Nutrition:  NPO.  Assessment: 29YOF tolerating TNA while NPO for gastric outlet obstruction.  Nutrition: receiving 100% of nutritional requirements (1843 kcal/day, 96g protein/day) with Clinimix E 5/15 at 80 ml/hr and Lipids at 10 ml/hr.  Lytes: wnl  CBGs: controlled (<150 mg/dL)  Plans for surgery today noted.   Plan:  Continue Clinimix E 5/15 at 80 ml/hr and Lipids at 10 ml/hr. Thiamine 100mg  per 24 hrs. Multivitamins on M-W-F only due to national shortage.   Ingeborg Fite, Brynda Greathouse K 09/06/2011,7:41 AM

## 2011-09-06 NOTE — Progress Notes (Signed)
PARENTERAL NUTRITION CONSULT NOTE - FOLLOW UP  Pharmacy Consult for TNA Indication: Gastric Outlet Obstruction  Allergies  Allergen Reactions  . Iodine Anaphylaxis  . Shellfish-Derived Products Anaphylaxis  . Adhesive (Tape) Other (See Comments)    Unknown reaction  . Contrast Media (Iodinated Diagnostic Agents) Nausea And Vomiting and Other (See Comments)    headache  . Latex Other (See Comments)    Plastic tape (unknown reaction)  . Topiramate (Topamax) Other (See Comments)    tremors  . Penicillins Rash    Patient Measurements: Height: 5\' 4"  (162.6 cm) Weight: 143 lb 8.3 oz (65.1 kg) IBW/kg (Calculated) : 54.7    Vital Signs: Temp: 98.4 F (36.9 C) (11/08 0535) Temp src: Oral (11/08 0535) BP: 100/67 mmHg (11/08 0535) Pulse Rate: 101  (11/08 0535) Intake/Output from previous day: 11/07 0701 - 11/08 0700 In: 3481.3 [I.V.:840; NG/GT:500; TPN:2141.3] Out: 1100 [Urine:1100] Intake/Output from this shift:    Labs:  [facility] 09/05/11 0420  WBC 5.1  HGB 8.3*  HCT 27.9*  PLT 432*  APTT --  INR --     [facility] 09/06/11 0647 09/05/11 0420  NA 137 135  K 3.6 3.9  CL 101 99  CO2 30 31  GLUCOSE 109* 101*  BUN 16 16  CREATININE 0.62 0.63  LABCREA -- --  CREAT24HRUR -- --  CALCIUM 9.0 9.1  MG 1.7 --  PHOS 4.0 --  PROT 5.9* --  ALBUMIN 2.3* --  AST 20 --  ALT 15 --  ALKPHOS 84 --  BILITOT 0.1* --  BILIDIR -- --  IBILI -- --  PREALBUMIN -- --  CHOLHDL -- --  CHOL -- --   Estimated Creatinine Clearance: 88.8 ml/min (by C-G formula based on Cr of 0.62).    Basename 09/06/11 0729 09/05/11 2323 09/05/11 1404  GLUCAP 101* 106* 138*    Medications:  Scheduled:     . ciprofloxacin  400 mg Intravenous 120 min pre-op  . levothyroxine  38 mcg Intravenous QAC breakfast  . metoprolol  5 mg Intravenous Q12H  . pantoprazole (PROTONIX) IV  40 mg Intravenous Q12H  . pramipexole  0.125 mg Oral Daily  . sodium chloride  10 mL Intravenous Q12H  . sodium  chloride  3 mL Intravenous Q12H    Insulin Requirements in the past 24 hours:  None.  Current Nutrition:  NPO.  Assessment: 29YOF tolerating TNA while NPO for gastric outlet obstruction.  Nutrition: receiving 100% of nutritional requirements (1843 kcal/day, 96g protein/day) with Clinimix E 5/15 at 80 ml/hr and Lipids at 10 ml/hr.  Lytes: wnl  CBGs: controlled (<150 mg/dL)  Plans for surgery today noted.   Plan:  Continue Clinimix E 5/15 at 80 ml/hr and Lipids at 10 ml/hr. Will add MVI and Trace minerals to TNA today (provided every MWF only due to national shortage).   Joelie Schou, Brynda Greathouse K 09/06/2011,7:39 AM

## 2011-09-06 NOTE — Transfer of Care (Signed)
Immediate Anesthesia Transfer of Care Note  Patient: [name]  Procedure(s) Performed:  LAPAROSCOPIC GASTROJEJUNOSTOMY  Patient Location: PACU  Anesthesia Type: General  Level of Consciousness: awake, alert  and oriented  Airway & Oxygen Therapy: Patient Spontanous Breathing  Post-op Assessment: Report given to PACU RN  Post vital signs: Reviewed  Complications: No apparent anesthesia complications

## 2011-09-06 NOTE — Anesthesia Preprocedure Evaluation (Addendum)
Anesthesia Evaluation  Patient identified by MRN, date of birth, ID band Patient awake    Reviewed: Allergy & Precautions, H&P , NPO status , Patient's Chart, lab work & pertinent test results  History of Anesthesia Complications Negative for: history of anesthetic complications  Airway Mallampati: II TM Distance: >3 FB Neck ROM: Full    Dental  (+) Teeth Intact and Dental Advisory Given,    Pulmonary neg pulmonary ROS,    Pulmonary exam normal       Cardiovascular neg cardio ROS     Neuro/Psych PSYCHIATRIC DISORDERS Anxiety Depression Negative Neurological ROS     GI/Hepatic Neg liver ROS, GERD-  Medicated and Controlled,  Endo/Other  Hypothyroidism   Renal/GU negative Renal ROS  Genitourinary negative   Musculoskeletal negative musculoskeletal ROS (+)   Abdominal Normal abdominal exam  (+)   Peds negative pediatric ROS (+)  Hematology negative hematology ROS (+)   Anesthesia Other Findings NG tube in  Reproductive/Obstetrics negative OB ROS                        Anesthesia Physical Anesthesia Plan  ASA: II  Anesthesia Plan: General   Post-op Pain Management:    Induction: Intravenous, Rapid sequence and Cricoid pressure planned  Airway Management Planned: Oral ETT  Additional Equipment:   Intra-op Plan:   Post-operative Plan:   Informed Consent: I have reviewed the patients History and Physical, chart, labs and discussed the procedure including the risks, benefits and alternatives for the proposed anesthesia with the patient or authorized representative who has indicated his/her understanding and acceptance.   Dental advisory given  Plan Discussed with: CRNA and Surgeon  Anesthesia Plan Comments:         Anesthesia Quick Evaluation

## 2011-09-06 NOTE — Progress Notes (Signed)
Subjective: Patient with some complaints of headache. And somewhat anxious about procedure today.  Objective: Vital signs in last 24 hours: Filed Vitals:   09/05/11 0700 09/05/11 1400 09/05/11 2100 09/06/11 0535  BP: 106/64 102/62 101/65 100/67  Pulse: 103 103 97 101  Temp: 98.4 F (36.9 C) 97.7 F (36.5 C) 99.6 F (37.6 C) 98.4 F (36.9 C)  TempSrc: Oral Oral Oral Oral  Resp: 20 20 20 16   Height:      Weight:      SpO2: 97% 96% 98% 94%    Intake/Output Summary (Last 24 hours) at 09/06/11 0857 Last data filed at 09/06/11 0600  Gross per 24 hour  Intake 3481.33 ml  Output   1100 ml  Net 2381.33 ml    Weight change:   General: Alert, awake, oriented x3, in no acute distress. Heart: Regular rate and rhythm, without murmurs, rubs, gallops. Lungs: Clear to auscultation bilaterally. Abdomen: Soft, nontender, nondistended, positive bowel sounds. Extremities: No clubbing cyanosis or edema with positive pedal pulses. Neuro: Grossly intact, nonfocal.   Lab Results:  Basename 09/06/11 0647 09/05/11 0420  NA 137 135  K 3.6 3.9  CL 101 99  CO2 30 31  GLUCOSE 109* 101*  BUN 16 16  CREATININE 0.62 0.63  CALCIUM 9.0 9.1  MG 1.7 --  PHOS 4.0 --    Basename 09/06/11 0647  AST 20  ALT 15  ALKPHOS 84  BILITOT 0.1*  PROT 5.9*  ALBUMIN 2.3*   No results found for this basename: LIPASE:2,AMYLASE:2 in the last 72 hours  Basename 09/05/11 0420  WBC 5.1  NEUTROABS --  HGB 8.3*  HCT 27.9*  MCV 87.5  PLT 432*   No results found for this basename: CKTOTAL:3,CKMB:3,CKMBINDEX:3,TROPONINI:3 in the last 72 hours No results found for this basename: POCBNP:3 in the last 72 hours No results found for this basename: DDIMER:2 in the last 72 hours No results found for this basename: HGBA1C:2 in the last 72 hours No results found for this basename: CHOL:2,HDL:2,LDLCALC:2,TRIG:2,CHOLHDL:2,LDLDIRECT:2 in the last 72 hours No results found for this basename:  TSH,T4TOTAL,FREET3,T3FREE,THYROIDAB in the last 72 hours No results found for this basename: VITAMINB12:2,FOLATE:2,FERRITIN:2,TIBC:2,IRON:2,RETICCTPCT:2 in the last 72 hours  Micro Results: No results found for this or any previous visit (from the past 240 hour(s)).  Studies/Results: No results found.  Medications:     . ciprofloxacin  400 mg Intravenous 120 min pre-op  . levothyroxine  38 mcg Intravenous QAC breakfast  . metoprolol  5 mg Intravenous Q12H  . pantoprazole (PROTONIX) IV  40 mg Intravenous Q12H  . pramipexole  0.125 mg Oral Daily  . sodium chloride  10 mL Intravenous Q12H  . sodium chloride  3 mL Intravenous Q12H    Assessment/Plan  Principal Problem:  1.Complete gastric outlet obstruction-patient is scheduled for surgery today.Continue TN,A continue NG tube, per general surgery.  2. Depression -Stable. Unable to place on medications secondary to #1.  3.Anxiety-Ativan as needed  4. Migraine-continue current pain regimen. Once patient is post surgery and on by mouth's will reconsult neurology for further evaluation and recommendations.  5. Hypothyroidism- Synthroid.  5.RLS (restless legs syndrome)-Mirapex.        LOS: 22 days   THOMPSON,DANIEL 09/06/2011, 8:57 AM

## 2011-09-06 NOTE — Op Note (Signed)
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  Asst.: Gaynelle Adu M.D.  Anesthesia Gen. 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 We have recommended proceeding with laparoscopic and possible open revision of her gastrojejunostomy. The nature of the procedure, its indications, and risks of anesthetic complications, bleeding, infection, anastomotic leak, and possible need for open procedure. Patient is brought to the operating room and placed in the supine position and general endotracheal anesthesia was induced. She had received preoperative IV antibiotics and antiembolism stockings were in place. The abdomen was widely sterilely prepped and draped. Patient and procedure were verified. Access was obtained with a 12 mm Hassan trocar just above the umbilicus. A 5 mm trocar was placed laterally in the left upper quadrant. There were actually very minimal intra-abdominal adhesions. The left lobe of the liver was elevated to a 5 mm epigastric site using the [facility] retractor. Finally a 12 mm trocar and a 5 mm trocar were placed in the right upper quadrant. The gastric pouch was somewhat enlarged and dilated. There were some adhesions to the undersurface of the left lobe of the liver which were completely taken down. There were actually minimal adhesions around the distal stomach and the Roux limb and gastrojejunostomy. I elected to resect the distal pouch and the anastomosis. The Roux limb was divided just distal to the anastomosis with a single firing of the 60 mm linear stapler. Following this  the mesentery of the previous small bowel anastomosis was taken down with the harmonic scalpel. Adhesions posterior to the gastric pouch were divided and then short gastric vessels along the greater curve were taken down with the harmonic scalpel up to the point of planned resection along the greater curve. The anastomosis was then resected by dividing the stomach beginning at the lesser curve with the linear 60 mm Gold stapler taking 3 firings across the gastric pouch and up across the greater curve creating an approximately 5 cm tubular pouch. The specimen was then placed in an Endo Catch bag for later retrieval. The Roux limb was brought up to the gastric pouch with no tension and anastomosis was created with an initial posterior row of running 2-0 Vicryl. The Ewald tube was passed into the gastric pouch as a stent and enterotomies were created in the pouch and the small bowel with the harmonic scalpel. An approximately 2-1/2-3 cm anastomosis was then created with the blue load 45 mm linear stapler. The common enterotomy was then closed from either end with running 2-0 Vicryl. The Ewald tube was then passed back down through the anastomosis and an outer layer of seromuscular running 2-0 Vicryl was placed. The Ewald tube was removed. Dr. Andrey Campanile performed upper endoscopy and with the outlet of the pouch tensely clamped under saline irrigation there was no evidence of leak. The sutures staple line to the gastro-jejunostomy were coated with Tisseel tissue sealant. There was no visible bleeding, intestinal injury other problem. The Nathanson retractor and all trochars were removed after bringing the Endo Catch bag up to the umbilical incision. This fascial incision was lengthened slightly and the specimen removed. This fascial incision was closed with 0 Vicryl. Skin incisions were infiltrated with Marcaine and closed with  Monocryl and Dermabond. Sponge needle and instrument counts were correct. The patient was taken  to recovery in good condition.   Mariella Saa MD, FACS  09/06/2011, 12:18 PM

## 2011-09-06 NOTE — Anesthesia Procedure Notes (Signed)
Date/Time: 09/06/2011 9:14 AM Performed by: Mechele Dawley Intubation Type: Rapid sequence and Circoid Pressure applied

## 2011-09-06 NOTE — Preoperative (Signed)
Beta Blockers   Reason not to administer Beta Blockers:Pt took Metoprolol 09-05-11 at 2200

## 2011-09-06 NOTE — Op Note (Signed)
OPERATIVE REPORT   PREOPERATIVE DIAGNOSIS: Gastric Outlet Obstruction, status post laparoscopic Roux-  en-Y gastric bypass.   POSTOPERATIVE DIAGNOSIS: Gastric Outlet Obstruction, status post laparoscopic Roux-  en-Y gastric bypass.   PROCEDURE: Esophagogastrojejunoscopy.   SURGEON: Mary Sella. Andrey Campanile, M.D.   ANESTHESIA: General endotracheal.   ESTIMATED BLOOD LOSS: None.   INDICATION FOR PROCEDURE: The patient is a 30 year old female who has  undergone a laparoscopic Roux-en-Y gastric bypass previously. She developed a gastric outlet obstruction at her gastrojejunostomy which was unamendable to endoscopic dilation. I  am doing an upper endoscopy to evaluate the gastric pouch and  anastomosis.  The patient is under general anesthesia in a supine position. Dr. [name] is manning the laparoscope and clamping off the jejunum.   PROCEDURE NOTE: A flexible Olympus endoscope was passed down the back  of throat into the esophagus and stomach pouch without difficulty. The  esophagogastric junction was noted at 42-cm. The gastrojejunal  anastomosis was at 43-44 cm for an approximate 5-5.5-cm pouch. There was no  bleeding from the staple line. The mucosa looked viable and I  insufflated air to distend the stomach pouch and jejunum with Dr. [name] clamped off the jejunum. We flooded the upper abdomen with saline.  There was no bubbles or evidence of air leak.  Photos were taken of the anastomosis which was widely patent.  I was able to easily intubate the Roux limb without difficulty. The patient tolerated the procedure well. Dr. [name] will dictate the  Roux-en-Y gastric bypass. I then withdrew the scope without difficulty.

## 2011-09-07 ENCOUNTER — Inpatient Hospital Stay (HOSPITAL_COMMUNITY): Payer: Managed Care, Other (non HMO)

## 2011-09-07 LAB — DIFFERENTIAL
Band Neutrophils: 5 % (ref 0–10)
Basophils Relative: 1 % (ref 0–1)
Eosinophils Relative: 0 % (ref 0–5)
Lymphs Abs: 0.5 10*3/uL — ABNORMAL LOW (ref 0.7–4.0)
Monocytes Absolute: 0.5 10*3/uL (ref 0.1–1.0)
Neutro Abs: 10.3 10*3/uL — ABNORMAL HIGH (ref 1.7–7.7)
Neutrophils Relative %: 86 % — ABNORMAL HIGH (ref 43–77)

## 2011-09-07 LAB — CBC
Hemoglobin: 8.7 g/dL — ABNORMAL LOW (ref 12.0–15.0)
MCH: 26.4 pg (ref 26.0–34.0)
Platelets: 481 10*3/uL — ABNORMAL HIGH (ref 150–400)
RBC: 3.29 MIL/uL — ABNORMAL LOW (ref 3.87–5.11)
WBC: 11.4 10*3/uL — ABNORMAL HIGH (ref 4.0–10.5)

## 2011-09-07 LAB — GLUCOSE, CAPILLARY

## 2011-09-07 LAB — MAGNESIUM: Magnesium: 1.6 mg/dL (ref 1.5–2.5)

## 2011-09-07 MED ORDER — METOPROLOL TARTRATE 1 MG/ML IV SOLN
5.0000 mg | Freq: Two times a day (BID) | INTRAVENOUS | Status: DC
  Administered 2011-09-07 – 2011-09-08 (×3): 5 mg via INTRAVENOUS
  Filled 2011-09-07 (×6): qty 5

## 2011-09-07 MED ORDER — FAT EMULSION 20 % IV EMUL
250.0000 mL | INTRAVENOUS | Status: AC
  Administered 2011-09-07: 250 mL via INTRAVENOUS
  Filled 2011-09-07: qty 250

## 2011-09-07 MED ORDER — MORPHINE SULFATE 4 MG/ML IJ SOLN
INTRAMUSCULAR | Status: AC
  Administered 2011-09-07: 4 mg
  Filled 2011-09-07: qty 1

## 2011-09-07 MED ORDER — LORAZEPAM 2 MG/ML IJ SOLN
1.0000 mg | INTRAMUSCULAR | Status: DC | PRN
  Administered 2011-09-07 – 2011-09-08 (×4): 1 mg via INTRAVENOUS
  Administered 2011-09-08: 2 mg via INTRAVENOUS
  Filled 2011-09-07 (×5): qty 1

## 2011-09-07 MED ORDER — IOHEXOL 300 MG/ML  SOLN: 150.0000 mL | Freq: Once | INTRAMUSCULAR | Status: AC | PRN

## 2011-09-07 MED ORDER — LEVOTHYROXINE SODIUM 100 MCG IV SOLR
38.0000 ug | Freq: Every day | INTRAVENOUS | Status: DC
  Administered 2011-09-07 – 2011-09-11 (×5): 38 ug via INTRAVENOUS
  Filled 2011-09-07 (×5): qty 1.9

## 2011-09-07 MED ORDER — MORPHINE SULFATE 4 MG/ML IJ SOLN
INTRAMUSCULAR | Status: AC
  Administered 2011-09-07: 2 mg via INTRAVENOUS
  Filled 2011-09-07: qty 1

## 2011-09-07 MED ORDER — THIAMINE HCL 100 MG/ML IJ SOLN
INTRAVENOUS | Status: AC
  Administered 2011-09-07: 19:00:00 via INTRAVENOUS
  Filled 2011-09-07: qty 2000

## 2011-09-07 NOTE — Progress Notes (Signed)
Subjective: Patient complaining of abdominal pain. She also complaining of some nausea. No emesis. No headache. No flatus, no BM.  Objective: Vital signs in last 24 hours: Filed Vitals:   09/06/11 1745 09/06/11 1845 09/06/11 2219 09/07/11 0600  BP: 115/76 124/79 122/82 123/83  Pulse: 129 127 110 104  Temp: 98.6 F (37 C)  98.5 F (36.9 C) 99.7 F (37.6 C)  TempSrc: Oral  Oral Oral  Resp: 18 20 18 20   Height:      Weight:      SpO2: 100% 100% 90% 97%    Intake/Output Summary (Last 24 hours) at 09/07/11 1150 Last data filed at 09/07/11 0700  Gross per 24 hour  Intake   3000 ml  Output    400 ml  Net   2600 ml    Weight change:   General: Alert, awake, oriented x3, in no acute distress. Heart: Regular rate and rhythm, without murmurs, rubs, gallops. Lungs: Clear to auscultation bilaterally. Abdomen: Soft, diffude TTP, nondistended, positive decreased bowel sounds. Extremities: No clubbing cyanosis or edema with positive pedal pulses. Neuro: Grossly intact, nonfocal.   Lab Results:  Basename 09/06/11 1855 09/06/11 0647 09/05/11 0420  NA -- 137 135  K -- 3.6 3.9  CL -- 101 99  CO2 -- 30 31  GLUCOSE -- 109* 101*  BUN -- 16 16  CREATININE 0.65 0.62 --  CALCIUM -- 9.0 9.1  MG -- 1.7 --  PHOS -- 4.0 --    Basename 09/06/11 0647  AST 20  ALT 15  ALKPHOS 84  BILITOT 0.1*  PROT 5.9*  ALBUMIN 2.3*   No results found for this basename: LIPASE:2,AMYLASE:2 in the last 72 hours  Basename 09/07/11 0400 09/06/11 1900 09/05/11 0420  WBC 11.4* -- 5.1  NEUTROABS 10.3* -- --  HGB 8.7* 8.1* --  HCT 28.1* 26.6* --  MCV 85.4 -- 87.5  PLT 481* -- 432*   No results found for this basename: CKTOTAL:3,CKMB:3,CKMBINDEX:3,TROPONINI:3 in the last 72 hours No results found for this basename: POCBNP:3 in the last 72 hours No results found for this basename: DDIMER:2 in the last 72 hours No results found for this basename: HGBA1C:2 in the last 72 hours No results found for this  basename: CHOL:2,HDL:2,LDLCALC:2,TRIG:2,CHOLHDL:2,LDLDIRECT:2 in the last 72 hours No results found for this basename: TSH,T4TOTAL,FREET3,T3FREE,THYROIDAB in the last 72 hours No results found for this basename: VITAMINB12:2,FOLATE:2,FERRITIN:2,TIBC:2,IRON:2,RETICCTPCT:2 in the last 72 hours  Micro Results: No results found for this or any previous visit (from the past 240 hour(s)).  Studies/Results: No results found.  Medications:     . TPN (CLINIMIX) +/- additives   Intravenous Once   And  . fat emulsion  250 mL Intravenous Once  . heparin  5,000 Units Subcutaneous Q8H  . morphine      . DISCONTD: levothyroxine  38 mcg Intravenous QAC breakfast  . DISCONTD: metoprolol  5 mg Intravenous Q12H  . DISCONTD: pantoprazole (PROTONIX) IV  40 mg Intravenous Q12H  . DISCONTD: pramipexole  0.125 mg Oral Daily  . DISCONTD: sodium chloride  10 mL Intravenous Q12H  . DISCONTD: sodium chloride  3 mL Intravenous Q12H    Assessment/Plan Principal Problem:  *Complete gastric outlet obstruction Active Problems:  Depression  Anxiety  Migraine  RLS (restless legs syndrome)  Hypothyroidism   1.Complete gastric outlet obstruction-patient is s/p laproscopic revision of gastrojejunostomy 09/06/11. Gastrograffin swallow eval pending.Continue TNA, per general surgery.  2. Depression -Stable. Unable to place on medications secondary to #1.  3.Anxiety-Ativan  as needed  4. Migraine-continue current pain regimen. Once patient is post surgery and on by mouth's will reconsult neurology for further evaluation and recommendations. Will inform neurology as pt is postop. 5. Hypothyroidism- Synthroid.  5.RLS (restless legs syndrome)-Mirapex.     LOS: 23 days   THOMPSON,DANIEL 09/07/2011, 11:50 AM

## 2011-09-07 NOTE — Progress Notes (Signed)
1 Day Post-Op  Subjective: Moderate pain, no nausea  Objective: Vital signs in last 24 hours: Temp:  [96.9 F (36.1 C)-99.7 F (37.6 C)] 99.7 F (37.6 C) (11/09 0600) Pulse Rate:  [104-129] 104  (11/09 0600) Resp:  [9-20] 20  (11/09 0600) BP: (112-130)/(70-86) 123/83 mmHg (11/09 0600) SpO2:  [90 %-100 %] 97 % (11/09 0600) FiO2 (%):  [28 %] 28 % (11/08 1535) Last BM Date: 08/31/11  Intake/Output from previous day: 11/08 0701 - 11/09 0700 In: 4500 [I.V.:1750; IV Piggyback:500; TPN:2250] Out: 500 [Urine:500] Intake/Output this shift:    General appearance: alert and no distress GI: abnormal findings:  Moderate tenderness, non distended  Lab Results:   Basename 09/07/11 0400 09/06/11 1900 09/05/11 0420  WBC 11.4* -- 5.1  HGB 8.7* 8.1* --  HCT 28.1* 26.6* --  PLT 481* -- 432*   BMET  Basename 09/06/11 1855 09/06/11 0647 09/05/11 0420  NA -- 137 135  K -- 3.6 3.9  CL -- 101 99  CO2 -- 30 31  GLUCOSE -- 109* 101*  BUN -- 16 16  CREATININE 0.65 0.62 --  CALCIUM -- 9.0 9.1   PT/INR No results found for this basename: LABPROT:2,INR:2 in the last 72 hours ABG No results found for this basename: PHART:2,PCO2:2,PO2:2,HCO3:2 in the last 72 hours  Studies/Results: No results found.  Anti-infectives: Anti-infectives     Start     Dose/Rate Route Frequency Ordered Stop   09/05/11 0015   ciprofloxacin (CIPRO) IVPB 400 mg        400 mg 200 mL/hr over 60 Minutes Intravenous 120 min pre-op 09/05/11 0000 09/06/11 0905          Assessment/Plan: s/p Procedure(s): LAPAROSCOPIC GASTROJEJUNOSTOMY Stable post op For g graffin swallow today   LOS: 23 days    Carsen Machi T 09/07/2011

## 2011-09-07 NOTE — Progress Notes (Signed)
[id number]/Donoven Pett, RN, BSN, CCM/CHART REVIEW FOR UR PERFORMED. 

## 2011-09-07 NOTE — Progress Notes (Signed)
PARENTERAL NUTRITION CONSULT NOTE - FOLLOW UP  Pharmacy Consult for TNA Indication: Gastric Outlet Obstruction  Allergies  Allergen Reactions  . Iodine Anaphylaxis  . Shellfish-Derived Products Anaphylaxis  . Adhesive (Tape) Other (See Comments)    Unknown reaction  . Contrast Media (Iodinated Diagnostic Agents) Nausea And Vomiting and Other (See Comments)    headache  . Latex Other (See Comments)    Plastic tape (unknown reaction)  . Topiramate (Topamax) Other (See Comments)    tremors  . Penicillins Rash    Patient Measurements: Height: 5\' 4"  (162.6 cm) Weight: 143 lb 8.3 oz (65.1 kg) IBW/kg (Calculated) : 54.7    Vital Signs: Temp: 99.7 F (37.6 C) (11/09 0600) Temp src: Oral (11/09 0600) BP: 123/83 mmHg (11/09 0600) Pulse Rate: 104  (11/09 0600) Intake/Output from previous day: 11/08 0701 - 11/09 0700 In: 4500 [I.V.:1750; IV Piggyback:500; TPN:2250] Out: 500 [Urine:500] Intake/Output from this shift:    Labs:  [facility] 09/07/11 0400 09/06/11 1900 09/05/11 0420  WBC 11.4* -- 5.1  HGB 8.7* 8.1* 8.3*  HCT 28.1* 26.6* 27.9*  PLT 481* -- 432*  APTT -- -- --  INR -- -- --     Basename 09/06/11 1855 09/06/11 0647 09/05/11 0420  NA -- 137 135  K -- 3.6 3.9  CL -- 101 99  CO2 -- 30 31  GLUCOSE -- 109* 101*  BUN -- 16 16  CREATININE 0.65 0.62 0.63  LABCREA -- -- --  CREAT24HRUR -- -- --  CALCIUM -- 9.0 9.1  MG -- 1.7 --  PHOS -- 4.0 --  PROT -- 5.9* --  ALBUMIN -- 2.3* --  AST -- 20 --  ALT -- 15 --  ALKPHOS -- 84 --  BILITOT -- 0.1* --  BILIDIR -- -- --  IBILI -- -- --  PREALBUMIN -- -- --  CHOLHDL -- -- --  CHOL -- -- --   Estimated Creatinine Clearance: 88.8 ml/min (by C-G formula based on Cr of 0.65).    Basename 09/06/11 2252 09/06/11 1403 09/06/11 0729  GLUCAP 121* 155* 101*    Medications:  Scheduled:     . ciprofloxacin  400 mg Intravenous 120 min pre-op  . TPN (CLINIMIX) +/- additives   Intravenous Once   And  . fat emulsion   250 mL Intravenous Once  . heparin  5,000 Units Subcutaneous Q8H  . morphine      . DISCONTD: levothyroxine  38 mcg Intravenous QAC breakfast  . DISCONTD: metoprolol  5 mg Intravenous Q12H  . DISCONTD: pantoprazole (PROTONIX) IV  40 mg Intravenous Q12H  . DISCONTD: pramipexole  0.125 mg Oral Daily  . DISCONTD: sodium chloride  10 mL Intravenous Q12H  . DISCONTD: sodium chloride  3 mL Intravenous Q12H    Insulin Requirements in the past 24 hours:  None.  Current Nutrition:  NPO.  Assessment: 29 YOF tolerating TNA for gastric outlet obstruction now POD #1 s/p laparoscopic gastrojejunostomy.  Nutrition: receiving 100% of nutritional requirements (1843 kcal/day, 96g protein/day) with Clinimix E 5/15 at 80 ml/hr and Lipids at 10 ml/hr.  Lytes: No new labs today  CBGs: controlled (<150 mg/dL)  WBC up s/p surgery  All TNA labs appeared to be discontinued post-op yesterday.   Plan:   Continue Clinimix E 5/15 at 80 ml/hr and Lipids at 10 ml/hr.    Thiamine 100 mg IV daily  Add multivitamins and trace elements to TNA MWF due to national shortage.   Re-enter all TNA labs, first  occurrence to start tomorrow since no labs post-op today.  Geoffry Paradise Thi 09/07/2011,8:16 AM

## 2011-09-07 NOTE — Progress Notes (Signed)
Nutrition Follow Up Note  Diet: Bariatric clear liquid, however pt not taking any liquids r/t nausea per family statement  TNA: Clinimix 5/15 at 34ml/hr with lipids at 105ml/hr - provides 1843 calories, 96g protein, meeting ~100% estimated nutritional needs  -Noted pt s/p laparoscopic revision of gastrojejunostomy on 11/8, and noted DG of upper GI series today showed no evidence for leak at gastrojejunostomy. 11/5 PALB 15.8, up from 9.5 on 10/29  Scheduled Meds:   . TPN (CLINIMIX) +/- additives   Intravenous Once   And  . fat emulsion  250 mL Intravenous Once  . heparin  5,000 Units Subcutaneous Q8H  . levothyroxine  38 mcg Intravenous Daily  . metoprolol  5 mg Intravenous Q12H  . morphine      . DISCONTD: levothyroxine  38 mcg Intravenous QAC breakfast  . DISCONTD: metoprolol  5 mg Intravenous Q12H  . DISCONTD: pantoprazole (PROTONIX) IV  40 mg Intravenous Q12H  . DISCONTD: pramipexole  0.125 mg Oral Daily  . DISCONTD: sodium chloride  10 mL Intravenous Q12H  . DISCONTD: sodium chloride  3 mL Intravenous Q12H   Continuous Infusions:   . fat emulsion    . TPN (CLINIMIX) +/- additives    . DISCONTD: dextrose 5 % and 0.45% NaCl 35 mL/hr at 09/05/11 0600  . DISCONTD: fat emulsion 250 mL (09/05/11 1814)  . DISCONTD: fat emulsion 250 mL (09/06/11 1815)  . DISCONTD: sodium chloride    . DISCONTD: TPN (CLINIMIX) +/- additives 80 mL/hr at 09/05/11 1813  . DISCONTD: TPN (CLINIMIX) +/- additives 80 mL/hr at 09/06/11 1815   PRN Meds:.acetaminophen (TYLENOL) oral liquid 160 mg/5 mL, iohexol, LORazepam, morphine, ondansetron (ZOFRAN) IV, oxyCODONE-acetaminophen, promethazine, DISCONTD: diphenhydrAMINE, DISCONTD: LORazepam, DISCONTD: magic mouthwash, DISCONTD: menthol-cetylpyridinium, DISCONTD: metoCLOPramide (REGLAN) injection, DISCONTD: phenol, DISCONTD: promethazine, DISCONTD: sodium chloride  CMP     Component Value Date/Time   NA 137 09/06/2011 0647   K 3.6 09/06/2011 0647   CL 101  09/06/2011 0647   CO2 30 09/06/2011 0647   GLUCOSE 109* 09/06/2011 0647   BUN 16 09/06/2011 0647   CREATININE 0.65 09/06/2011 1855   CALCIUM 9.0 09/06/2011 0647   PROT 5.9* 09/06/2011 0647   ALBUMIN 2.3* 09/06/2011 0647   AST 20 09/06/2011 0647   ALT 15 09/06/2011 0647   ALKPHOS 84 09/06/2011 0647   BILITOT 0.1* 09/06/2011 0647   GFRNONAA >90 09/06/2011 1855   GFRAA >90 09/06/2011 1855    Intake/Output Summary (Last 24 hours) at 09/07/11 1609 Last data filed at 09/07/11 0700  Gross per 24 hour  Intake   1410 ml  Output    300 ml  Net   1110 ml   Nutrition diagnosis - Inadequate oral intake - persists  Goal - TNA to meet > 90% of nutritional needs - met.   Plan - TNA per pharmacy. Noted pt's current weight 68.7kg and admission weight 58.8kg, with an apparent almost 22 pound fluid weight gain, as pt NPO since admission until today. No educational needs at this time. Will continue to monitor for diet advancement.   Dietitian # [phone]

## 2011-09-08 LAB — COMPREHENSIVE METABOLIC PANEL
ALT: 37 U/L — ABNORMAL HIGH (ref 0–35)
AST: 47 U/L — ABNORMAL HIGH (ref 0–37)
CO2: 28 mEq/L (ref 19–32)
Chloride: 101 mEq/L (ref 96–112)
GFR calc Af Amer: >90 mL/min (ref >90)
GFR calc non Af Amer: >90 mL/min (ref >90)
Glucose, Bld: 96 mg/dL (ref 70–99)
Sodium: 137 mEq/L (ref 135–145)
Total Bilirubin: 0.2 mg/dL — ABNORMAL LOW (ref 0.3–1.2)

## 2011-09-08 LAB — GLUCOSE, CAPILLARY
Glucose-Capillary: 108 mg/dL — ABNORMAL HIGH (ref 70–99)
Glucose-Capillary: 155 mg/dL — ABNORMAL HIGH (ref 70–99)

## 2011-09-08 LAB — DIFFERENTIAL
Eosinophils Absolute: 0.3 10*3/uL (ref 0.0–0.7)
Lymphs Abs: 2.2 10*3/uL (ref 0.7–4.0)
Monocytes Absolute: 0.5 10*3/uL (ref 0.1–1.0)
Monocytes Relative: 7 % (ref 3–12)
Neutrophils Relative %: 57 % (ref 43–77)

## 2011-09-08 LAB — CBC
HCT: 29.8 % — ABNORMAL LOW (ref 36.0–46.0)
Hemoglobin: 9.1 g/dL — ABNORMAL LOW (ref 12.0–15.0)
MCH: 26.5 pg (ref 26.0–34.0)
RBC: 3.44 MIL/uL — ABNORMAL LOW (ref 3.87–5.11)

## 2011-09-08 MED ORDER — MORPHINE SULFATE 4 MG/ML IJ SOLN
INTRAMUSCULAR | Status: AC
  Administered 2011-09-08: 4 mg
  Filled 2011-09-08: qty 1

## 2011-09-08 MED ORDER — SODIUM CHLORIDE 0.9 % IJ SOLN: 9.0000 mL | INTRAMUSCULAR | Status: DC | PRN

## 2011-09-08 MED ORDER — DIPHENHYDRAMINE HCL 50 MG/ML IJ SOLN
12.5000 mg | Freq: Four times a day (QID) | INTRAMUSCULAR | Status: DC | PRN
  Filled 2011-09-08: qty 1

## 2011-09-08 MED ORDER — HYDROMORPHONE 0.3 MG/ML IV SOLN
INTRAVENOUS | Status: DC
  Administered 2011-09-08: 0.1 mg via INTRAVENOUS
  Administered 2011-09-09: 4.8 mg via INTRAVENOUS
  Administered 2011-09-09: 4.7 mg via INTRAVENOUS
  Filled 2011-09-08: qty 25

## 2011-09-08 MED ORDER — FAT EMULSION 20 % IV EMUL
250.0000 mL | INTRAVENOUS | Status: AC
  Administered 2011-09-08: 250 mL via INTRAVENOUS
  Filled 2011-09-08: qty 250

## 2011-09-08 MED ORDER — THIAMINE HCL 100 MG/ML IJ SOLN
INTRAVENOUS | Status: AC
  Administered 2011-09-08: 18:00:00 via INTRAVENOUS
  Filled 2011-09-08: qty 2000

## 2011-09-08 MED ORDER — MORPHINE SULFATE 4 MG/ML IJ SOLN
INTRAMUSCULAR | Status: AC
  Filled 2011-09-08: qty 1

## 2011-09-08 MED ORDER — HYDROMORPHONE HCL PF 1 MG/ML IJ SOLN
0.5000 mg | INTRAMUSCULAR | Status: DC | PRN
  Administered 2011-09-08 – 2011-09-09 (×10): 1 mg via INTRAVENOUS
  Filled 2011-09-08 (×10): qty 1

## 2011-09-08 MED ORDER — NALOXONE HCL 0.4 MG/ML IJ SOLN: 0.4000 mg | INTRAMUSCULAR | Status: DC | PRN

## 2011-09-08 MED ORDER — LORAZEPAM 2 MG/ML IJ SOLN
1.0000 mg | INTRAMUSCULAR | Status: DC | PRN
  Administered 2011-09-08 – 2011-09-10 (×5): 1 mg via INTRAVENOUS
  Filled 2011-09-08 (×4): qty 1

## 2011-09-08 MED ORDER — DIPHENHYDRAMINE HCL 12.5 MG/5ML PO ELIX: 12.5000 mg | ORAL_SOLUTION | Freq: Four times a day (QID) | ORAL | Status: DC | PRN

## 2011-09-08 NOTE — Progress Notes (Signed)
PARENTERAL NUTRITION CONSULT NOTE - FOLLOW UP  Pharmacy Consult for TNA Indication: Gastric Outlet Obstruction  Allergies  Allergen Reactions  . Iodine Anaphylaxis  . Shellfish-Derived Products Anaphylaxis  . Adhesive (Tape) Other (See Comments)    Unknown reaction  . Contrast Media (Iodinated Diagnostic Agents) Nausea And Vomiting and Other (See Comments)    headache  . Latex Other (See Comments)    Plastic tape (unknown reaction)  . Topiramate (Topamax) Other (See Comments)    tremors  . Penicillins Rash    Patient Measurements: Height: 5\' 4"  (162.6 cm) Weight: 151 lb 7.3 oz (68.7 kg) IBW/kg (Calculated) : 54.7    Vital Signs: Temp: 98.4 F (36.9 C) (11/10 0600) Temp src: Oral (11/10 0600) BP: 113/77 mmHg (11/10 0600) Pulse Rate: 101  (11/10 0600) Intake/Output from previous day: 11/09 0701 - 11/10 0700 In: 818 [TPN:818] Out: 1250 [Urine:1250] Intake/Output from this shift:    Labs:  [facility] 09/08/11 0633 09/07/11 0400 09/06/11 1900  WBC 7.1 11.4* --  HGB 9.1* 8.7* 8.1*  HCT 29.8* 28.1* 26.6*  PLT 445* 481* --  APTT -- -- --  INR -- -- --     Basename 09/08/11 0633 09/07/11 1400 09/06/11 1855 09/06/11 0647  NA 137 -- -- 137  K 3.6 -- -- 3.6  CL 101 -- -- 101  CO2 28 -- -- 30  GLUCOSE 96 -- -- 109*  BUN 14 -- -- 16  CREATININE 0.65 -- 0.65 0.62  LABCREA -- -- -- --  CREAT24HRUR -- -- -- --  CALCIUM 9.3 -- -- 9.0  MG 1.7 1.6 -- 1.7  PHOS 3.9 -- -- 4.0  PROT 5.9* -- -- 5.9*  ALBUMIN 2.3* -- -- 2.3*  AST 47* -- -- 20  ALT 37* -- -- 15  ALKPHOS 86 -- -- 84  BILITOT 0.2* -- -- 0.1*  BILIDIR -- -- -- --  IBILI -- -- -- --  PREALBUMIN -- -- -- --  CHOLHDL -- -- -- --  CHOL -- -- -- --   Estimated Creatinine Clearance: 97.9 ml/min (by C-G formula based on Cr of 0.65).    Basename 09/08/11 0726 09/07/11 2130 09/06/11 2252  GLUCAP 108* 102* 121*    Medications:  Scheduled:     Marland Kitchen TPN (CLINIMIX) +/- additives   Intravenous Once   And  .  fat emulsion  250 mL Intravenous Once  . heparin  5,000 Units Subcutaneous Q8H  . levothyroxine  38 mcg Intravenous Daily  . metoprolol  5 mg Intravenous Q12H  . morphine      . morphine      . morphine        Insulin Requirements in the past 24 hours:  None.  Current Nutrition:  NPO.  Assessment: 29 YOF tolerating TNA for gastric outlet obstruction now POD #1 s/p laparoscopic gastrojejunostomy.  Nutrition: receiving 100% of nutritional requirements (1843 kcal/day, 96g protein/day) with Clinimix E 5/15 at 80 ml/hr and Lipids at 10 ml/hr.  Lytes: WNL, Mag,Phos in good range  CBGs: controlled (avg 100 mg/dl)   Plan:   Continue Clinimix E 5/15 at 80 ml/hr and Lipids at 10 ml/hr.    Thiamine 100 mg IV daily  Add multivitamins and trace elements to TNA MWF due to national shortage.   Full TNA labs Monday.Chilton Si, Bethanny Toelle L 09/08/2011,11:10 AM

## 2011-09-08 NOTE — Progress Notes (Signed)
2 Days Post-Op  Subjective: Sore.  No nausea today.  Objective: Vital signs in last 24 hours: Temp:  [98.4 F (36.9 C)-99.1 F (37.3 C)] 98.4 F (36.9 C) (11/10 0600) Pulse Rate:  [101-108] 101  (11/10 0600) Resp:  [18-20] 20  (11/10 0600) BP: (107-124)/(77-89) 113/77 mmHg (11/10 0600) SpO2:  [97 %-100 %] 99 % (11/10 0600) Weight:  [151 lb 7.3 oz (68.7 kg)] 151 lb 7.3 oz (68.7 kg) (11/09 1300) Last BM Date: 08/31/11  Intake/Output from previous day: 11/09 0701 - 11/10 0700 In: 818 [TPN:818] Out: 1250 [Urine:1250] Intake/Output this shift:    PE: Abd-soft, tender around incisions which are clean, dry, and intact, active BS  Lab Results:   Basename 09/08/11 0633 09/07/11 0400  WBC 7.1 11.4*  HGB 9.1* 8.7*  HCT 29.8* 28.1*  PLT 445* 481*   BMET  Basename 09/08/11 0633 09/06/11 1855 09/06/11 0647  NA 137 -- 137  K 3.6 -- 3.6  CL 101 -- 101  CO2 28 -- 30  GLUCOSE 96 -- 109*  BUN 14 -- 16  CREATININE 0.65 0.65 --  CALCIUM 9.3 -- 9.0   PT/INR No results found for this basename: LABPROT:2,INR:2 in the last 72 hours ABG No results found for this basename: PHART:2,PCO2:2,PO2:2,HCO3:2 in the last 72 hours  Studies/Results: Dg Ugi W/water Sol Cm Omnipaque Swallow/ugi. Radiologist To Call Report To Surgeon; Surgeon To Notify Nurse Of Results.  09/07/2011  *RADIOLOGY REPORT*  Clinical Data:Gastric bypass revision secondary to obstruction at the initial gastrojejunostomy.  WATER SOLUBLE UPPER GI SERIES  Technique:  Single-column upper GI series was performed using water soluble contrast.  Fluoroscopy Time: 4.2 minutes  Contrast: Water-soluble contrast material.  Approximately 30 ml.  Comparison: 08/22/2011  Findings: Pre-procedure KUB shows some mildly distended small bowel loops in the abdomen.  The patient was given to swallows of water soluble contrast material which pooled in the proximal stomach.  After some period of observation, there is no evidence for gastric emptying  through the gastrojejunostomy.  The patient was turned R P O which demonstrated reflux but no wedging.  The patient was turned LPO which resulted in opacification of the proximal efferent loop. After about 8 minutes of observation, there had been no substantial further opacification of the efferent limb.  No evidence for contrast leak at the gastrojejunostomy.  IMPRESSION: Emptying through the gastrojejunostomy occurred only with the patient in an LPO semiupright position.  This opacifies a slightly distended proximal efferent loop with decreased peristalsis, suggesting ileus.  No contrast extravasation at the gastrojejunostomy to suggest leak.  Brisk gastroesophageal reflux noted with the patient in an RPO position.  I discussed these findings by telephone with Dr. [name] prior to the patient leaving the imaging suite.  Original Report Authenticated By: ERIC A. MANSELL, M.D.    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   Karalyne Nusser J 09/08/2011

## 2011-09-08 NOTE — Consult Note (Signed)
Subjective: 30 years old woman that we saw during this hospitalization for difficulties with her sleep. I determined that she likely has RLS and possibly OSA. At the time, we could not treat her symptoms due to her being NPO for intestinal obstruction. Currently, she may take PO medications again. She has been getting IV iron for severe iron deficiency anemia. Currently she denies symptoms of RLS, which seems to have improved with treatment. She denies however that her sleep is significantly better now.   Objective: Vital signs in last 24 hours: Temp:  [98.4 F (36.9 C)-98.8 F (37.1 C)] 98.4 F (36.9 C) (11/10 0600) Pulse Rate:  [101-105] 101  (11/10 0600) Resp:  [19-20] 20  (11/10 0600) BP: (107-115)/(77-89) 113/77 mmHg (11/10 0600) SpO2:  [98 %-100 %] 99 % (11/10 0600)  Intake/Output from previous day: 11/09 0701 - 11/10 0700 In: 818 [TPN:818] Out: 1250 [Urine:1250] Intake/Output this shift:   Nutritional status: Gastric Bypass  Neuro: AAO*3, no aphasia, follows complex commands, EOMI, PERRL, no facial asymmetry, tongue midline, V1-V3 intact b/l, no drift, 5/5 in all ext. Bilaterally, no deficit to LT/PP, no dysmetria on F to N b/l, reflexes: 2+ throughout, no ataxia  Lab Results:  Basename 09/08/11 0633 09/07/11 0400 09/06/11 1855 09/06/11 0647  WBC 7.1 11.4* -- --  HGB 9.1* 8.7* -- --  HCT 29.8* 28.1* -- --  PLT 445* 481* -- --  NA 137 -- -- 137  K 3.6 -- -- 3.6  CL 101 -- -- 101  CO2 28 -- -- 30  GLUCOSE 96 -- -- 109*  BUN 14 -- -- 16  CREATININE 0.65 -- 0.65 --  CALCIUM 9.3 -- -- 9.0  LABA1C -- -- -- --   Lipid Panel  Basename 09/08/11 0634  CHOL 120  TRIG 97  HDL --  CHOLHDL --  VLDL --  LDLCALC --    Studies/Results: Dg Ugi W/water Sol Cm Omnipaque Swallow/ugi. Radiologist To Call Report To Surgeon; Surgeon To Notify Nurse Of Results.  09/07/2011  *RADIOLOGY REPORT*  Clinical Data:Gastric bypass revision secondary to obstruction at the initial  gastrojejunostomy.  WATER SOLUBLE UPPER GI SERIES  Technique:  Single-column upper GI series was performed using water soluble contrast.  Fluoroscopy Time: 4.2 minutes  Contrast: Water-soluble contrast material.  Approximately 30 ml.  Comparison: 08/22/2011  Findings: Pre-procedure KUB shows some mildly distended small bowel loops in the abdomen.  The patient was given to swallows of water soluble contrast material which pooled in the proximal stomach.  After some period of observation, there is no evidence for gastric emptying through the gastrojejunostomy.  The patient was turned R P O which demonstrated reflux but no wedging.  The patient was turned LPO which resulted in opacification of the proximal efferent loop. After about 8 minutes of observation, there had been no substantial further opacification of the efferent limb.  No evidence for contrast leak at the gastrojejunostomy.  IMPRESSION: Emptying through the gastrojejunostomy occurred only with the patient in an LPO semiupright position.  This opacifies a slightly distended proximal efferent loop with decreased peristalsis, suggesting ileus.  No contrast extravasation at the gastrojejunostomy to suggest leak.  Brisk gastroesophageal reflux noted with the patient in an RPO position.  I discussed these findings by telephone with Dr. [name] prior to the patient leaving the imaging suite.  Original Report Authenticated By: ERIC A. MANSELL, M.D.    Medications: I have reviewed the patient's current medications.  Assessment/Plan: 30 years old woman with chronic sleep problems who has had  symptoms of RLS that resolved with iron treatment, she also has a mallampati grade 4 and snores at night, which puts her at risk for OSA 1) Continue Oral iron supplementation as outpatient 2) Needs outpatient sleep study - please refer to [facility] or another sleep specialist  Sharesa Kemp

## 2011-09-09 LAB — GLUCOSE, CAPILLARY: Glucose-Capillary: 106 mg/dL — ABNORMAL HIGH (ref 70–99)

## 2011-09-09 MED ORDER — FAT EMULSION 20 % IV EMUL
250.0000 mL | INTRAVENOUS | Status: AC
  Administered 2011-09-09: 250 mL via INTRAVENOUS
  Filled 2011-09-09: qty 250

## 2011-09-09 MED ORDER — METOPROLOL TARTRATE 1 MG/ML IV SOLN
5.0000 mg | Freq: Two times a day (BID) | INTRAVENOUS | Status: DC
  Administered 2011-09-10 (×2): 5 mg via INTRAVENOUS
  Filled 2011-09-09 (×5): qty 5

## 2011-09-09 MED ORDER — HYDROMORPHONE HCL PF 2 MG/ML IJ SOLN
0.5000 mg | INTRAMUSCULAR | Status: DC | PRN
  Administered 2011-09-09 – 2011-09-10 (×7): 1 mg via INTRAVENOUS
  Filled 2011-09-09 (×3): qty 1

## 2011-09-09 MED ORDER — THIAMINE HCL 100 MG/ML IJ SOLN
INTRAVENOUS | Status: AC
  Administered 2011-09-09: 18:00:00 via INTRAVENOUS
  Filled 2011-09-09: qty 1000

## 2011-09-09 MED ORDER — KETOROLAC TROMETHAMINE 30 MG/ML IJ SOLN
30.0000 mg | Freq: Four times a day (QID) | INTRAMUSCULAR | Status: DC | PRN
  Administered 2011-09-09: 30 mg via INTRAVENOUS

## 2011-09-09 MED ORDER — KETOROLAC TROMETHAMINE 15 MG/ML IJ SOLN
INTRAMUSCULAR | Status: AC
  Administered 2011-09-09: 30 mg via INTRAVENOUS
  Filled 2011-09-09: qty 2

## 2011-09-09 NOTE — Progress Notes (Signed)
  3 Days Post-Op  Subjective: No issues overnight.  Tolerating liquids. Still on TPN  Objective: Vital signs in last 24 hours: Temp:  [98 F (36.7 C)-98.9 F (37.2 C)] 98.2 F (36.8 C) (11/11 0533) Pulse Rate:  [92-109] 92  (11/11 0533) Resp:  [16-20] 18  (11/11 0533) BP: (92-106)/(63-69) 92/63 mmHg (11/11 0533) SpO2:  [97 %-100 %] 98 % (11/11 0913) FiO2 (%):  [98 %] 98 % (11/10 2115) Last BM Date: 09/02/11  Intake/Output from previous day: 11/10 0701 - 11/11 0700 In: 240 [P.O.:240] Out: -  Intake/Output this shift:    General appearance: alert, cooperative and no distress GI: soft, appropriate incisional tenderness, no sign of infection, nondistended and no peritonitis  Lab Results:  @LABLAST2 (wbc:2,hgb:2,hct:2,plt:2) BMET  Basename 09/08/11 0633 09/06/11 1855  NA 137 --  K 3.6 --  CL 101 --  CO2 28 --  GLUCOSE 96 --  BUN 14 --  CREATININE 0.65 0.65  CALCIUM 9.3 --   PT/INR No results found for this basename: LABPROT:2,INR:2 in the last 72 hours ABG No results found for this basename: PHART:2,PCO2:2,PO2:2,HCO3:2 in the last 72 hours  Studies/Results: Dg Ugi W/water Sol Cm Omnipaque Swallow/ugi. Radiologist To Call Report To Surgeon; Surgeon To Notify Nurse Of Results.  09/07/2011  *RADIOLOGY REPORT*  Clinical Data:Gastric bypass revision secondary to obstruction at the initial gastrojejunostomy.  WATER SOLUBLE UPPER GI SERIES  Technique:  Single-column upper GI series was performed using water soluble contrast.  Fluoroscopy Time: 4.2 minutes  Contrast: Water-soluble contrast material.  Approximately 30 ml.  Comparison: 08/22/2011  Findings: Pre-procedure KUB shows some mildly distended small bowel loops in the abdomen.  The patient was given to swallows of water soluble contrast material which pooled in the proximal stomach.  After some period of observation, there is no evidence for gastric emptying through the gastrojejunostomy.  The patient was turned R P O which  demonstrated reflux but no wedging.  The patient was turned LPO which resulted in opacification of the proximal efferent loop. After about 8 minutes of observation, there had been no substantial further opacification of the efferent limb.  No evidence for contrast leak at the gastrojejunostomy.  IMPRESSION: Emptying through the gastrojejunostomy occurred only with the patient in an LPO semiupright position.  This opacifies a slightly distended proximal efferent loop with decreased peristalsis, suggesting ileus.  No contrast extravasation at the gastrojejunostomy to suggest leak.  Brisk gastroesophageal reflux noted with the patient in an RPO position.  I discussed these findings by telephone with Dr. [name] prior to the patient leaving the imaging suite.  Original Report Authenticated By: ERIC A. MANSELL, M.D.    Anti-infectives: Anti-infectives     Start     Dose/Rate Route Frequency Ordered Stop   09/05/11 0015   ciprofloxacin (CIPRO) IVPB 400 mg        400 mg 200 mL/hr over 60 Minutes Intravenous 120 min pre-op 09/05/11 0000 09/06/11 0905          Assessment/Plan: s/p Procedure(s): LAPAROSCOPIC GASTROJEJUNOSTOMY she seems to be doing well.  will be slow to advance diet but I would like to try to wean the TPN and convert to oral medications.  No clinical evidence of a leak to this point as HR okay, remains AF, and exam appropriate.  LOS: 25 days    Lodema Pilot DAVID 09/09/2011

## 2011-09-09 NOTE — Progress Notes (Addendum)
3 Days Post-Op  Subjective: Tolerating clear liquids.  Passing gas.  No n/v.  Better pain control with PCA.  Objective: Vital signs in last 24 hours: Temp:  [98 F (36.7 C)-98.9 F (37.2 C)] 98.2 F (36.8 C) (11/11 0533) Pulse Rate:  [92-109] 92  (11/11 0533) Resp:  [16-20] 18  (11/11 0533) BP: (92-106)/(63-69) 92/63 mmHg (11/11 0533) SpO2:  [97 %-100 %] 98 % (11/11 0913) FiO2 (%):  [98 %] 98 % (11/10 2115) Last BM Date: 09/02/11  Intake/Output from previous day: 11/10 0701 - 11/11 0700 In: 240 [P.O.:240] Out: -  Intake/Output this shift:    PE: Abd-soft, less tender around incisions which are clean, dry, and intact, active BS  Lab Results:   Basename 09/08/11 0633 09/07/11 0400  WBC 7.1 11.4*  HGB 9.1* 8.7*  HCT 29.8* 28.1*  PLT 445* 481*   BMET  Basename 09/08/11 0633 09/06/11 1855  NA 137 --  K 3.6 --  CL 101 --  CO2 28 --  GLUCOSE 96 --  BUN 14 --  CREATININE 0.65 0.65  CALCIUM 9.3 --   PT/INR No results found for this basename: LABPROT:2,INR:2 in the last 72 hours ABG No results found for this basename: PHART:2,PCO2:2,PO2:2,HCO3:2 in the last 72 hours  Studies/Results: Dg Ugi W/water Sol Cm Omnipaque Swallow/ugi. Radiologist To Call Report To Surgeon; Surgeon To Notify Nurse Of Results.  09/07/2011  *RADIOLOGY REPORT*  Clinical Data:Gastric bypass revision secondary to obstruction at the initial gastrojejunostomy.  WATER SOLUBLE UPPER GI SERIES  Technique:  Single-column upper GI series was performed using water soluble contrast.  Fluoroscopy Time: 4.2 minutes  Contrast: Water-soluble contrast material.  Approximately 30 ml.  Comparison: 08/22/2011  Findings: Pre-procedure KUB shows some mildly distended small bowel loops in the abdomen.  The patient was given to swallows of water soluble contrast material which pooled in the proximal stomach.  After some period of observation, there is no evidence for gastric emptying through the gastrojejunostomy.  The  patient was turned R P O which demonstrated reflux but no wedging.  The patient was turned LPO which resulted in opacification of the proximal efferent loop. After about 8 minutes of observation, there had been no substantial further opacification of the efferent limb.  No evidence for contrast leak at the gastrojejunostomy.  IMPRESSION: Emptying through the gastrojejunostomy occurred only with the patient in an LPO semiupright position.  This opacifies a slightly distended proximal efferent loop with decreased peristalsis, suggesting ileus.  No contrast extravasation at the gastrojejunostomy to suggest leak.  Brisk gastroesophageal reflux noted with the patient in an RPO position.  I discussed these findings by telephone with Dr. [name] prior to the patient leaving the imaging suite.  Original Report Authenticated By: ERIC A. MANSELL, M.D.    Anti-infectives: Anti-infectives     Start     Dose/Rate Route Frequency Ordered Stop   09/05/11 0015   ciprofloxacin (CIPRO) IVPB 400 mg        400 mg 200 mL/hr over 60 Minutes Intravenous 120 min pre-op 09/05/11 0000 09/06/11 0454          Assessment Principal Problem:  *Complete gastric outlet obstruction-s/p revision of gastrojejunostomy 09/06/11-doing well Active Problems:  Depression  Anxiety  Migraine  RLS (restless legs syndrome)  Hypothyroidism    LOS: 25 days   Plan:  Advance diet. Make Lopressor prn   Severino Paolo J 09/09/2011

## 2011-09-09 NOTE — Progress Notes (Signed)
PARENTERAL NUTRITION CONSULT NOTE - FOLLOW UP  Pharmacy Consult for TNA Indication: Gastric Outlet Obstruction  Allergies  Allergen Reactions  . Iodine Anaphylaxis  . Shellfish-Derived Products Anaphylaxis  . Adhesive (Tape) Other (See Comments)    Unknown reaction  . Contrast Media (Iodinated Diagnostic Agents) Nausea And Vomiting and Other (See Comments)    headache  . Latex Other (See Comments)    Plastic tape (unknown reaction)  . Topiramate (Topamax) Other (See Comments)    tremors  . Penicillins Rash    Patient Measurements: Height: 5\' 4"  (162.6 cm) Weight: 151 lb 7.3 oz (68.7 kg) IBW/kg (Calculated) : 54.7    Vital Signs: Temp: 98.2 F (36.8 C) (11/11 0533) Temp src: Oral (11/11 0533) BP: 92/63 mmHg (11/11 0533) Pulse Rate: 92  (11/11 0533) Intake/Output from previous day: 11/10 0701 - 11/11 0700 In: 240 [P.O.:240] Out: -  Intake/Output from this shift:    Labs:  [facility] 09/08/11 0633 09/07/11 0400 09/06/11 1900  WBC 7.1 11.4* --  HGB 9.1* 8.7* 8.1*  HCT 29.8* 28.1* 26.6*  PLT 445* 481* --  APTT -- -- --  INR -- -- --     Basename 09/08/11 0634 09/08/11 0633 09/07/11 1400 09/06/11 1855  NA -- 137 -- --  K -- 3.6 -- --  CL -- 101 -- --  CO2 -- 28 -- --  GLUCOSE -- 96 -- --  BUN -- 14 -- --  CREATININE -- 0.65 -- 0.65  LABCREA -- -- -- --  CREAT24HRUR -- -- -- --  CALCIUM -- 9.3 -- --  MG -- 1.7 1.6 --  PHOS -- 3.9 -- --  PROT -- 5.9* -- --  ALBUMIN -- 2.3* -- --  AST -- 47* -- --  ALT -- 37* -- --  ALKPHOS -- 86 -- --  BILITOT -- 0.2* -- --  BILIDIR -- -- -- --  IBILI -- -- -- --  PREALBUMIN -- 17.0* -- --  CHOLHDL -- -- -- --  CHOL 120 -- -- --   Estimated Creatinine Clearance: 97.9 ml/min (by C-G formula based on Cr of 0.65).    Basename 09/09/11 0739 09/08/11 2121 09/08/11 1356  GLUCAP 89 99 155*    Medications:  Scheduled:     . heparin  5,000 Units Subcutaneous Q8H  . levothyroxine  38 mcg Intravenous Daily  .  metoprolol  5 mg Intravenous Q12H  . morphine      . DISCONTD: HYDROmorphone PCA 0.3 mg/mL   Intravenous Q4H  . DISCONTD: metoprolol  5 mg Intravenous Q12H  . DISCONTD: morphine        Insulin Requirements in the past 24 hours:  None, CBG 161,096,04/54 mg/dl.   Current Nutrition:  Bariatric CL diet.  Assessment: 29 YOF tolerating TNA for gastric outlet obstruction now  s/p laparoscopic gastrojejunostomy.  Nutrition: receiving 100% of nutritional requirements (1843 kcal/day, 96g protein/day) with Clinimix E 5/15 at 80 ml/hr and Lipids at 10 ml/hr.  Lytes: WNL, Mag,Phos in good range CBGs: controlled  avg 100's Plan:   Reduce rate of Clinimix E 5/15 to 40 ml/hr and Lipids at 10 ml/hr.    Thiamine 100 mg IV daily  Add multivitamins and trace elements to TNA MWF due to national shortage.   Full TNA labs Monday.  Slowly wean TNA, can easily taper and d/c TNA at 46ml/hr, reduced rate can help with appetite.Chilton Si, Antoinette Borgwardt L 09/09/2011,11:04 AM

## 2011-09-10 ENCOUNTER — Encounter (HOSPITAL_COMMUNITY): Payer: Self-pay | Admitting: General Surgery

## 2011-09-10 LAB — DIFFERENTIAL
Eosinophils Relative: 9 % — ABNORMAL HIGH (ref 0–5)
Lymphs Abs: 2.2 10*3/uL (ref 0.7–4.0)
Monocytes Relative: 7 % (ref 3–12)
Neutro Abs: 2.4 10*3/uL (ref 1.7–7.7)

## 2011-09-10 LAB — COMPREHENSIVE METABOLIC PANEL
AST: 52 U/L — ABNORMAL HIGH (ref 0–37)
BUN: 13 mg/dL (ref 6–23)
CO2: 28 mEq/L (ref 19–32)
Chloride: 103 mEq/L (ref 96–112)
Creatinine, Ser: 0.59 mg/dL (ref 0.50–1.10)
GFR calc non Af Amer: >90 mL/min (ref >90)
Total Bilirubin: 0.1 mg/dL — ABNORMAL LOW (ref 0.3–1.2)

## 2011-09-10 LAB — CHOLESTEROL, TOTAL: Cholesterol: 122 mg/dL (ref 0–200)

## 2011-09-10 LAB — PHOSPHORUS: Phosphorus: 3.6 mg/dL (ref 2.3–4.6)

## 2011-09-10 LAB — CBC
HCT: 27.2 % — ABNORMAL LOW (ref 36.0–46.0)
MCHC: 30.1 g/dL (ref 30.0–36.0)
RDW: 17.9 % — ABNORMAL HIGH (ref 11.5–15.5)

## 2011-09-10 LAB — MAGNESIUM: Magnesium: 1.8 mg/dL (ref 1.5–2.5)

## 2011-09-10 LAB — GLUCOSE, CAPILLARY: Glucose-Capillary: 104 mg/dL — ABNORMAL HIGH (ref 70–99)

## 2011-09-10 MED ORDER — FAT EMULSION 20 % IV EMUL
250.0000 mL | INTRAVENOUS | Status: DC
  Administered 2011-09-10: 250 mL via INTRAVENOUS
  Filled 2011-09-10: qty 250

## 2011-09-10 MED ORDER — KETOROLAC TROMETHAMINE 15 MG/ML IJ SOLN
INTRAMUSCULAR | Status: AC
  Administered 2011-09-10: 30 mg via INTRAVENOUS
  Filled 2011-09-10: qty 2

## 2011-09-10 MED ORDER — TRACE MINERALS CR-CU-MN-SE-ZN 10-1000-500-60 MCG/ML IV SOLN
INTRAVENOUS | Status: DC
  Administered 2011-09-10: 18:00:00 via INTRAVENOUS
  Filled 2011-09-10: qty 1000

## 2011-09-10 MED ORDER — POTASSIUM CHLORIDE 10 MEQ/100ML IV SOLN
10.0000 meq | INTRAVENOUS | Status: AC
  Administered 2011-09-10 (×3): 10 meq via INTRAVENOUS
  Filled 2011-09-10 (×3): qty 100

## 2011-09-10 MED ORDER — THIAMINE HCL 100 MG/ML IJ SOLN
INTRAVENOUS | Status: DC
  Filled 2011-09-10: qty 1000

## 2011-09-10 NOTE — Progress Notes (Signed)
  4 Days Post-Op  Subjective: Pain improving, no nausea with CL, starting protein shakes   Objective: Vital signs in last 24 hours: Temp:  [97.8 F (36.6 C)-98.5 F (36.9 C)] 97.8 F (36.6 C) (11/12 0600) Pulse Rate:  [83-96] 83  (11/12 0630) Resp:  [18] 18  (11/12 0630) BP: (89-107)/(58-73) 107/73 mmHg (11/12 0630) SpO2:  [98 %-100 %] 100 % (11/12 0600) Last BM Date: 09/09/11  Intake/Output from previous day:   Intake/Output this shift:    General appearance: no distress GI: normal findings: soft, non-tender  Lab Results:   Basename 09/10/11 0500 09/08/11 0633  WBC 5.4 7.1  HGB 8.2* 9.1*  HCT 27.2* 29.8*  PLT 445* 445*   BMET  Basename 09/10/11 0500 09/08/11 0633  NA 136 137  K 3.2* 3.6  CL 103 101  CO2 28 28  GLUCOSE 85 96  BUN 13 14  CREATININE 0.59 0.65  CALCIUM 8.7 9.3   PT/INR No results found for this basename: LABPROT:2,INR:2 in the last 72 hours ABG No results found for this basename: PHART:2,PCO2:2,PO2:2,HCO3:2 in the last 72 hours  Studies/Results: No results found.  Anti-infectives: Anti-infectives     Start     Dose/Rate Route Frequency Ordered Stop   09/05/11 0015   ciprofloxacin (CIPRO) IVPB 400 mg        400 mg 200 mL/hr over 60 Minutes Intravenous 120 min pre-op 09/05/11 0000 09/06/11 9629          Assessment/Plan: s/p Procedure(s): LAPAROSCOPIC GASTROJEJUNOSTOMY Doing well Protein shakes today, poss home in AM   LOS: 26 days    Sansa Alkema T 09/10/2011

## 2011-09-10 NOTE — Progress Notes (Signed)
PARENTERAL NUTRITION CONSULT NOTE - FOLLOW UP  Pharmacy Consult for TNA Indication: Gastric Outlet Obstruction  Allergies  Allergen Reactions  . Iodine Anaphylaxis  . Shellfish-Derived Products Anaphylaxis  . Adhesive (Tape) Other (See Comments)    Unknown reaction  . Contrast Media (Iodinated Diagnostic Agents) Nausea And Vomiting and Other (See Comments)    headache  . Latex Other (See Comments)    Plastic tape (unknown reaction)  . Topiramate (Topamax) Other (See Comments)    tremors  . Penicillins Rash    Patient Measurements: Height: 5\' 4"  (162.6 cm) Weight: 151 lb 7.3 oz (68.7 kg) IBW/kg (Calculated) : 54.7    Vital Signs: Temp: 98.1 F (36.7 C) (11/12 1002) Temp src: Oral (11/12 1002) BP: 113/80 mmHg (11/12 1002) Pulse Rate: 90  (11/12 1002) Intake/Output from previous day:   Intake/Output from this shift:    Labs:  [facility] 09/10/11 0500 09/08/11 0633  WBC 5.4 7.1  HGB 8.2* 9.1*  HCT 27.2* 29.8*  PLT 445* 445*  APTT -- --  INR -- --     Basename 09/10/11 0500 09/08/11 0634 09/08/11 0633 09/07/11 1400  NA 136 -- 137 --  K 3.2* -- 3.6 --  CL 103 -- 101 --  CO2 28 -- 28 --  GLUCOSE 85 -- 96 --  BUN 13 -- 14 --  CREATININE 0.59 -- 0.65 --  LABCREA -- -- -- --  CREAT24HRUR -- -- -- --  CALCIUM 8.7 -- 9.3 --  MG 1.8 -- 1.7 1.6  PHOS 3.6 -- 3.9 --  PROT 5.7* -- 5.9* --  ALBUMIN 2.3* -- 2.3* --  AST 52* -- 47* --  ALT 55* -- 37* --  ALKPHOS 95 -- 86 --  BILITOT 0.1* -- 0.2* --  BILIDIR -- -- -- --  IBILI -- -- -- --  PREALBUMIN -- -- 17.0* --  CHOLHDL -- -- -- --  CHOL 122 161 -- --   Estimated Creatinine Clearance: 97.9 ml/min (by C-G formula based on Cr of 0.59).    Basename 09/10/11 0736 09/09/11 2128 09/09/11 1417  GLUCAP 98 106* 113*    Medications:  Scheduled:     . heparin  5,000 Units Subcutaneous Q8H  . ketorolac      . ketorolac      . levothyroxine  38 mcg Intravenous Daily  . metoprolol  5 mg Intravenous Q12H     Insulin Requirements in the past 24 hours:  None, CBG 096,045,40/98 mg/dl.   Current Nutrition:  Bariatric CL diet.  Assessment: 29 YOF tolerating TNA for gastric outlet obstruction now  s/p laparoscopic gastrojejunostomy.  Nutrition: receiving 50% of nutritional requirements with Clinimix E 5/15 at 40 ml/hr and Lipids at 10 ml/hr. Had tapered TNA yesterday to half of goal rate to attempt to slowly advance diet. Decreasing TNA should help with appetite.   Lytes: K+ low at 3.2, Mag and Phos wnl  CBGs: controlled  avg 100's Plan:   Continued reduced rate of Clinimix E 5/15 to 40 ml/hr and Lipids at 10 ml/hr until MD indicates to discontinue TNA. May discontinue at any time without further weaning.  Will provide 3 runs of IV KCl today to replete.  Thiamine 100 mg IV daily  Add multivitamins and trace elements to TNA MWF due to national shortage.  [name] 09/10/2011,10:57 AM

## 2011-09-11 LAB — GLUCOSE, CAPILLARY
Glucose-Capillary: 119 mg/dL — ABNORMAL HIGH (ref 70–99)
Glucose-Capillary: 95 mg/dL (ref 70–99)

## 2011-09-11 MED ORDER — OXYCODONE-ACETAMINOPHEN 5-325 MG/5ML PO SOLN: 5.0000 mL | ORAL | Status: AC | PRN

## 2011-09-11 NOTE — Progress Notes (Signed)
  5 Days Post-Op  Subjective  Feels well without C/O.  Tol protein shakes without difficulty Objective: Vital signs in last 24 hours: Temp:  [97.9 F (36.6 C)-98.4 F (36.9 C)] 98.4 F (36.9 C) (11/13 0529) Pulse Rate:  [79-90] 79  (11/13 0529) Resp:  [18-19] 18  (11/13 0529) BP: (98-113)/(54-80) 98/60 mmHg (11/13 0529) SpO2:  [95 %-100 %] 100 % (11/13 0529) Last BM Date: 09/09/11  Intake/Output from previous day: 11/12 0701 - 11/13 0700 In: 1070 [I.V.:450; TPN:620] Out: -  Intake/Output this shift:    General appearance: no distress GI: soft, non-tender; bowel sounds normal; no masses,  no organomegaly  Lab Results:   Basename 09/10/11 0500  WBC 5.4  HGB 8.2*  HCT 27.2*  PLT 445*   BMET  Basename 09/10/11 0500  NA 136  K 3.2*  CL 103  CO2 28  GLUCOSE 85  BUN 13  CREATININE 0.59  CALCIUM 8.7   PT/INR No results found for this basename: LABPROT:2,INR:2 in the last 72 hours ABG No results found for this basename: PHART:2,PCO2:2,PO2:2,HCO3:2 in the last 72 hours  Studies/Results: No results found.  Anti-infectives: Anti-infectives     Start     Dose/Rate Route Frequency Ordered Stop   09/05/11 0015   ciprofloxacin (CIPRO) IVPB 400 mg        400 mg 200 mL/hr over 60 Minutes Intravenous 120 min pre-op 09/05/11 0000 09/06/11 1610          Assessment/Plan: s/p Procedure(s): LAPAROSCOPIC GASTROJEJUNOSTOMY  Doing well, ready for discharge  LOS: 27 days    Alto Gandolfo T 09/11/2011

## 2011-09-11 NOTE — Discharge Summary (Signed)
Physician Discharge Summary   Patient ID: [name] [id number] 30 y.o. 05/14/1981  Admit date: 08/15/2011  Discharge date and time: No discharge date for patient encounter. 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 She presents with several weeks of worsening and then persistent nausea and vomiting with inability to tolerate any by mouth intake  Workup on admission included an upper endoscopy which showed complete gastric outlet obstruction but the gastrojejunostomy unable to be traversed. Upper GI series showed complete obstruction of her gastric pouch. Patient was begun on TMA and NG suction. Attempt was initially made to manage her conservatively with 2 weeks of nutritional support and to repeat endoscopies to try to traverse her gastric outlet but this was unsuccessful.  5 days prior to this discharge she was taken to the operating room and underwent an uneventful laparoscopic revision of her gastrojejunostomy. Her postoperative course was very smooth. Postoperative Gastrografin swallow showed no leak or obstruction. She was able to be started on a clear liquid diet and was advanced over several days to protein shakes which she tolerated well.  At the time of discharge she is afebrile. Abdomen is soft and nontender. She is tolerating protein shakes well  Consults: GI  Significant Diagnostic Studies: radiology: UGI series and endoscopy: gastroscopy  Treatments: IV hydration, TPN and surgery: Revision gastrojejunostomy  Discharge Exam: Appears well. Incisions clean, abd  NT  Disposition: Home or Self Care  Patient Instructions:  Current Discharge Medication List    START taking these medications   Details  oxyCODONE-acetaminophen (ROXICET) 5-325 MG/5ML solution Take 5-10 mLs by mouth every 4 (four) hours as needed for pain. Qty: 500 mL, Refills: 0      CONTINUE these medications which have NOT CHANGED   Details  ALPRAZolam (XANAX) 1 MG tablet Take 1 mg by mouth 3 (three) times daily.      HYDROcodone-acetaminophen (NORCO) 10-325 MG per tablet Take 1 tablet by mouth 3 (three) times daily.      levothyroxine (SYNTHROID, LEVOTHROID) 75 MCG tablet Take 75 mcg by mouth daily.      metFORMIN (GLUCOPHAGE) 500 MG tablet Take 500 mg by mouth daily.      metoprolol (LOPRESSOR) 50 MG tablet Take 50 mg by mouth 2 (two) times daily.      Multiple Vitamins-Minerals (MULTIVITAMINS THER. W/MINERALS) TABS Take 1 tablet by mouth daily.      ondansetron (ZOFRAN-ODT) 4 MG disintegrating tablet Place 4 mg under the tongue 3 (three) times daily as needed. For nausea        Activity: activity as tolerated Diet: Protein shakes Wound Care: none needed  Follow-up with Dr [name] in 2 week.  SignedGlenna Fellows T 09/11/2011 9:59 AM

## 2011-09-21 ENCOUNTER — Telehealth (INDEPENDENT_AMBULATORY_CARE_PROVIDER_SITE_OTHER): Payer: Self-pay | Admitting: Surgery

## 2011-09-21 ENCOUNTER — Encounter (HOSPITAL_COMMUNITY): Payer: Self-pay | Admitting: Surgery

## 2011-09-21 NOTE — Telephone Encounter (Signed)
Pt req pain refill.  Eating well.  No N/V.  Reg BMs.  Understands I cannot call in roxicodone elixir.  OK w pills Called in vicodin #[phone]

## 2011-09-27 ENCOUNTER — Ambulatory Visit (INDEPENDENT_AMBULATORY_CARE_PROVIDER_SITE_OTHER): Payer: Managed Care, Other (non HMO) | Admitting: General Surgery

## 2011-09-27 ENCOUNTER — Encounter (INDEPENDENT_AMBULATORY_CARE_PROVIDER_SITE_OTHER): Payer: Self-pay | Admitting: General Surgery

## 2011-09-27 VITALS — BP 118/70 | HR 68 | Temp 98.1°F | Resp 16 | Ht 64.0 in | Wt 140.4 lb

## 2011-09-27 DIAGNOSIS — Z09 Encounter for follow-up examination after completed treatment for conditions other than malignant neoplasm: Secondary | ICD-10-CM

## 2011-09-27 NOTE — Progress Notes (Signed)
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 She is getting along quite well. She has to eat slowly and fills up quickly but has had no vomiting. She's had occasional mild nausea. She has some continued mild epigastric discomfort all consistent with early postop state.  On exam she appears well. Incisions well healed. Abdomen is soft with minimal epigastric tenderness.  Assessment plan: Doing well following laparoscopic revision of gastrojejunostomy. We discussed that she will be to eat very slowly with small meals and chew well as she has a small gastric pouch. I do not see a complication. I'll see her back in 6 weeks for more long-term followup.

## 2011-09-27 NOTE — Patient Instructions (Signed)
Call for any worsening symptoms and we will see her in 6 weeks.

## 2011-10-10 ENCOUNTER — Telehealth (INDEPENDENT_AMBULATORY_CARE_PROVIDER_SITE_OTHER): Payer: Self-pay | Admitting: General Surgery

## 2011-10-10 NOTE — Telephone Encounter (Signed)
Pt called and wanted something for pain and something for pain. The pt mother called and stated that her daughter was at home in [place]  crying with pain. I told the mother that if she is in that much in pain that she needs to go to the ER or the urgent care. The mother stated that she will not go to either one due to the last time care of the.

## 2011-11-08 ENCOUNTER — Encounter (INDEPENDENT_AMBULATORY_CARE_PROVIDER_SITE_OTHER): Payer: Managed Care, Other (non HMO) | Admitting: General Surgery

## 2011-11-21 ENCOUNTER — Ambulatory Visit (INDEPENDENT_AMBULATORY_CARE_PROVIDER_SITE_OTHER): Payer: Managed Care, Other (non HMO) | Admitting: General Surgery

## 2011-11-21 ENCOUNTER — Encounter (INDEPENDENT_AMBULATORY_CARE_PROVIDER_SITE_OTHER): Payer: Self-pay | Admitting: General Surgery

## 2011-11-21 VITALS — BP 96/68 | HR 84 | Temp 97.5°F | Resp 12 | Ht 64.5 in | Wt 145.0 lb

## 2011-11-21 DIAGNOSIS — K912 Postsurgical malabsorption, not elsewhere classified: Secondary | ICD-10-CM

## 2011-11-21 DIAGNOSIS — Z09 Encounter for follow-up examination after completed treatment for conditions other than malignant neoplasm: Secondary | ICD-10-CM

## 2011-11-21 LAB — CBC WITH DIFFERENTIAL/PLATELET
Basophils Absolute: 0 10*3/uL (ref 0.0–0.1)
Basophils Relative: 1 % (ref 0–1)
Eosinophils Absolute: 0.2 10*3/uL (ref 0.0–0.7)
Eosinophils Relative: 3 % (ref 0–5)
HCT: 41.4 % (ref 36.0–46.0)
MCH: 29.7 pg (ref 26.0–34.0)
MCHC: 30.4 g/dL (ref 30.0–36.0)
MCV: 97.6 fL (ref 78.0–100.0)
Monocytes Absolute: 0.4 10*3/uL (ref 0.1–1.0)
Platelets: 367 10*3/uL (ref 150–400)
RDW: 17.4 % — ABNORMAL HIGH (ref 11.5–15.5)

## 2011-11-21 LAB — COMPREHENSIVE METABOLIC PANEL
AST: 25 U/L (ref 0–37)
Alkaline Phosphatase: 67 U/L (ref 39–117)
BUN: 9 mg/dL (ref 6–23)
Calcium: 8.9 mg/dL (ref 8.4–10.5)
Chloride: 105 mEq/L (ref 96–112)
Creat: 0.81 mg/dL (ref 0.50–1.10)
Total Bilirubin: 0.3 mg/dL (ref 0.3–1.2)

## 2011-11-21 MED ORDER — PANTOPRAZOLE SODIUM 40 MG PO TBEC: 40.0000 mg | DELAYED_RELEASE_TABLET | Freq: Every day | ORAL | Status: DC

## 2011-11-21 NOTE — Progress Notes (Signed)
History: Patient returns now approximately 2 months following laparoscopic revision of her gastrojejunostomy due to complete gastric outlet obstruction. She continues to have difficulty with epigastric abdominal pain. She describes sharp more gas-like pain after eating which will then last most of the day. She also has nausea and fairly frequent vomiting after eating. On questioning she states she has had some fever up to 101. Bowel movements have been small and somewhat frequent. She is not really getting her energy back like she hoped she would.  Exam: BP 96/68  Pulse 84  Temp(Src) 97.5 F (36.4 C) (Temporal)  Resp 12  Ht 5' 4.5" (1.638 m)  Wt 145 lb (65.772 kg)  BMI 24.50 kg/m2  General: She appears well and better nourished that her last visit and in no distress. Abdomen: Incisions well healed. No distention. Minimal epigastric tenderness but no guarding or mass.  Assessment and plan: Status post laparoscopic revision of gastric outlet obstruction post Roux-en-Y gastric bypass. She has ongoing symptoms as above but is gaining weight and appears well. I recommended at this point that we check some lab work with a CBC and chemistries and try her empirically on a proton pump inhibitor. I think the symptoms may improve if she gets farther postop. See her back in a month. If she's failed to make any progress I would consider followup endoscopy. She will call me if she feels any worse.

## 2011-11-28 ENCOUNTER — Telehealth (INDEPENDENT_AMBULATORY_CARE_PROVIDER_SITE_OTHER): Payer: Self-pay

## 2011-11-28 NOTE — Progress Notes (Signed)
Patient aware of her results, can she resume full activities?

## 2011-11-28 NOTE — Telephone Encounter (Signed)
Patient aware of lab results, need a letter stating whether she's released from the physician or if there's still restrictions in place.

## 2011-11-29 NOTE — Progress Notes (Signed)
Patient aware of results/christy1/30/13

## 2011-12-19 ENCOUNTER — Encounter (INDEPENDENT_AMBULATORY_CARE_PROVIDER_SITE_OTHER): Payer: Managed Care, Other (non HMO) | Admitting: General Surgery

## 2012-01-11 ENCOUNTER — Encounter (INDEPENDENT_AMBULATORY_CARE_PROVIDER_SITE_OTHER): Payer: Self-pay | Admitting: General Surgery

## 2012-01-11 ENCOUNTER — Ambulatory Visit (INDEPENDENT_AMBULATORY_CARE_PROVIDER_SITE_OTHER): Payer: Managed Care, Other (non HMO) | Admitting: General Surgery

## 2012-01-11 VITALS — BP 124/90 | HR 88 | Temp 99.3°F | Ht 64.0 in | Wt 145.8 lb

## 2012-01-11 DIAGNOSIS — K9189 Other postprocedural complications and disorders of digestive system: Secondary | ICD-10-CM

## 2012-01-11 DIAGNOSIS — K929 Disease of digestive system, unspecified: Secondary | ICD-10-CM

## 2012-01-11 DIAGNOSIS — Y832 Surgical operation with anastomosis, bypass or graft as the cause of abnormal reaction of the patient, or of later complication, without mention of misadventure at the time of the procedure: Secondary | ICD-10-CM

## 2012-01-11 NOTE — Progress Notes (Signed)
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 She is feeling much better. She now is essentially able to eat most foods without pain or nausea. Some high roughage foods still bothering her somewhat but she is feeling well. She's gotten back into regular exercise.  Exam: BP 124/90  Pulse 88  Temp(Src) 99.3 F (37.4 C) (Temporal)  Ht 5\' 4"  (1.626 m)  Wt 145 lb 12.8 oz (66.134 kg)  BMI 25.03 kg/m2  SpO2 99%  Gen. she appears happy and healthy. Abdomen: Soft and nontender with well-healed wounds.  Assessment plan: Doing well following revision of gastrojejunostomy. I will see her back in one year for routine bariatric followup.

## 2012-12-02 ENCOUNTER — Encounter (INDEPENDENT_AMBULATORY_CARE_PROVIDER_SITE_OTHER): Payer: Self-pay | Admitting: General Surgery

## 2012-12-15 IMAGING — CT CT ABD-PELV W/O CM
2 of 4 series · 16 of 46 positions shown, 18 images · non-contrast
Comparison: None.

CLINICAL DATA: Medical clearance, vomiting, weight loss, anemia,
lupus, prior gastric bypass, appendectomy, cholecystectomy

CT ABDOMEN AND PELVIS WITHOUT CONTRAST
TECHNIQUE: Multidetector CT imaging of the abdomen and pelvis was
performed following the standard protocol without intravenous
contrast. IV contrast not utilized due to history of contrast
allergy.  No oral contrast administered.  Sagittal and coronal MPR
images reconstructed from axial data set.

[Series 2: abd/pel w/o · axial · non-contrast · 0.74mm/px · z∈[-422,-32]mm · 13 of 86 slices shown, 15 images]
[im 4/86  soft-tissue]
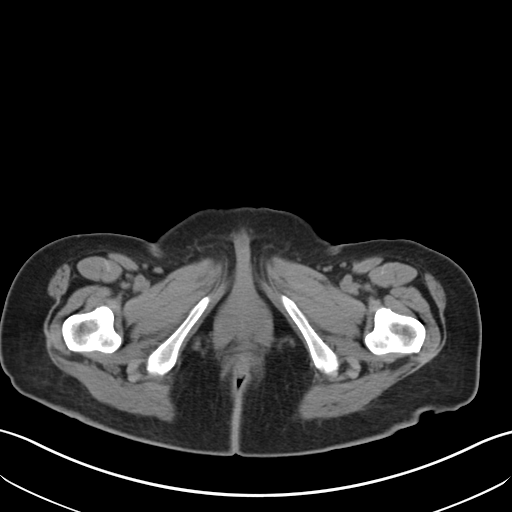
[im 4/86  bone]
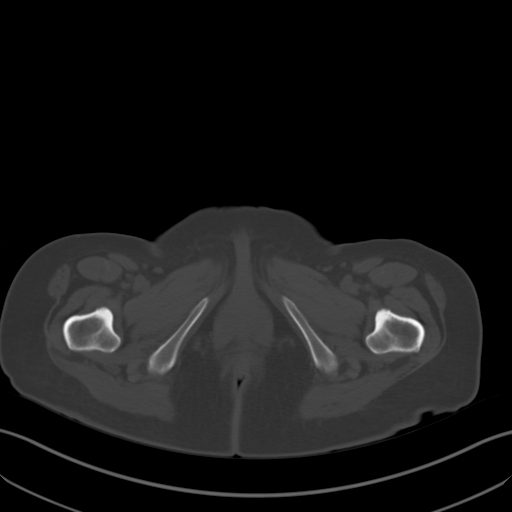
[im 12/86  soft-tissue]
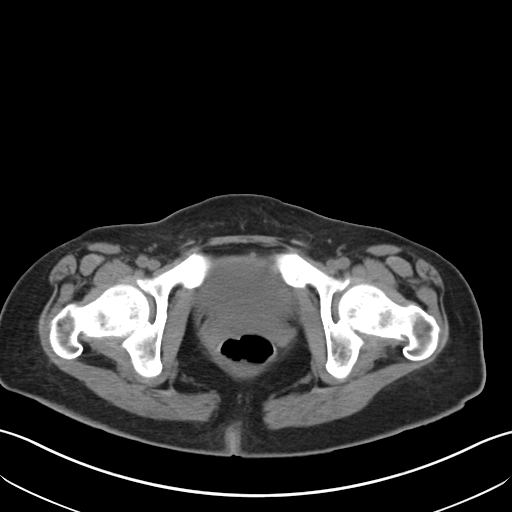
[im 19/86  soft-tissue]
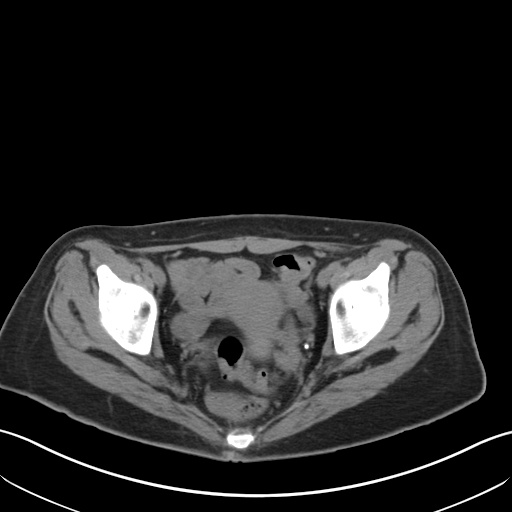
[im 23/86  soft-tissue]
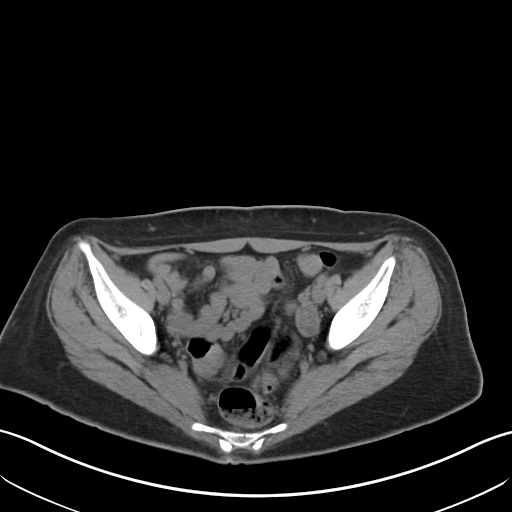
[im 30/86  soft-tissue]
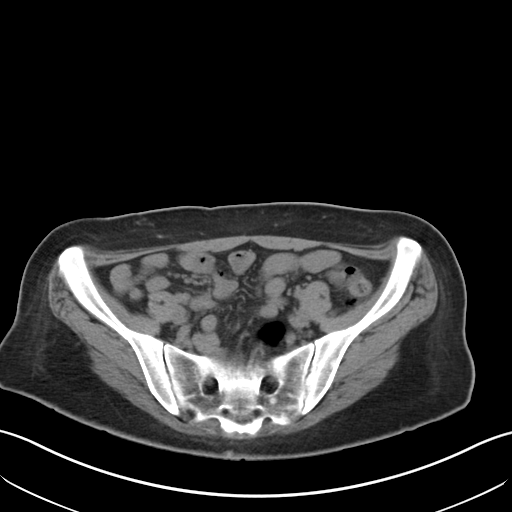
[im 37/86  soft-tissue]
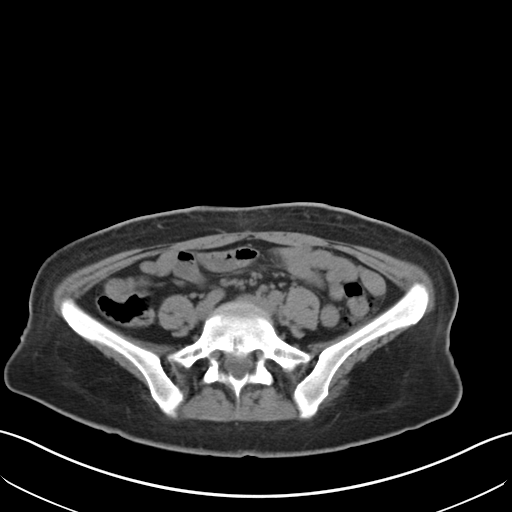
[im 45/86  soft-tissue]
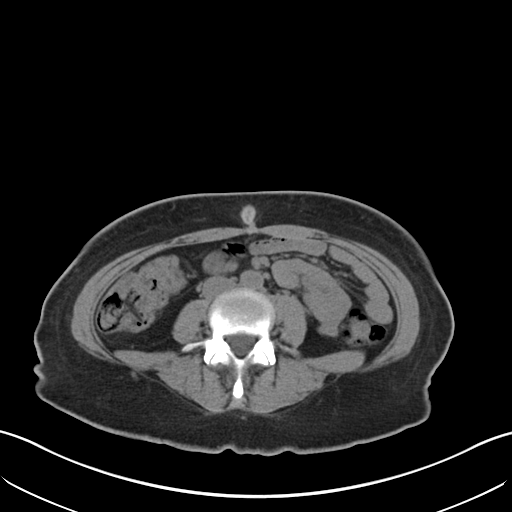
[im 49/86  soft-tissue]
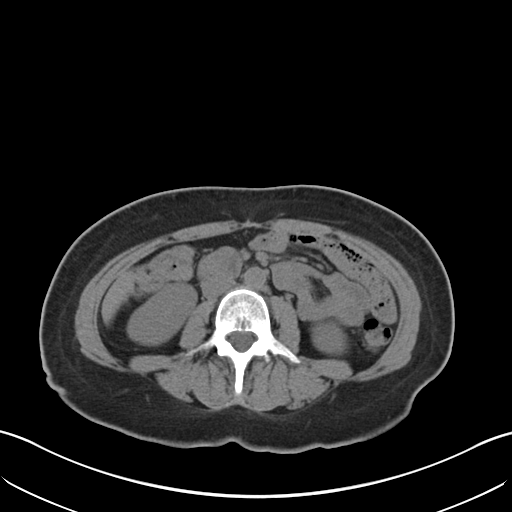
[im 56/86  soft-tissue]
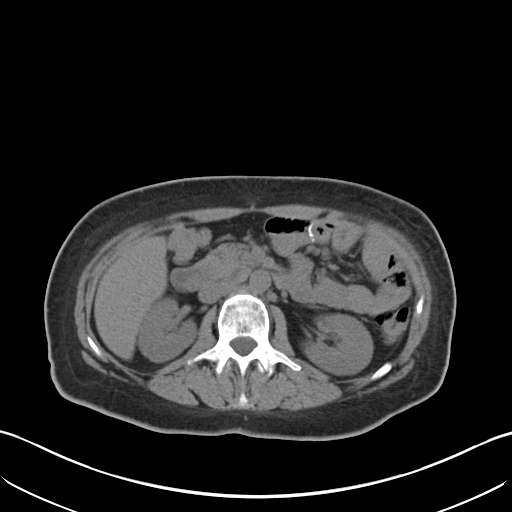
[im 56/86  bone]
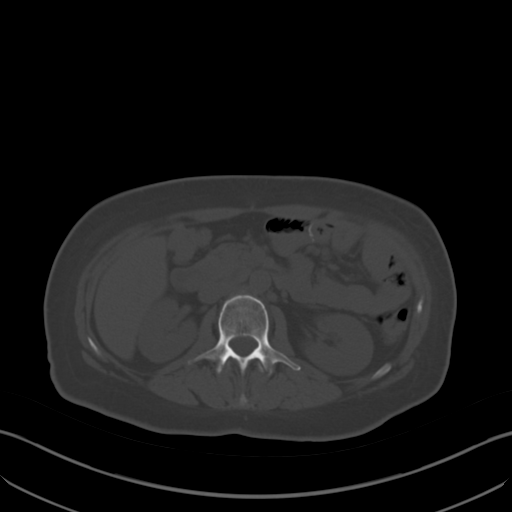
[im 63/86  soft-tissue]
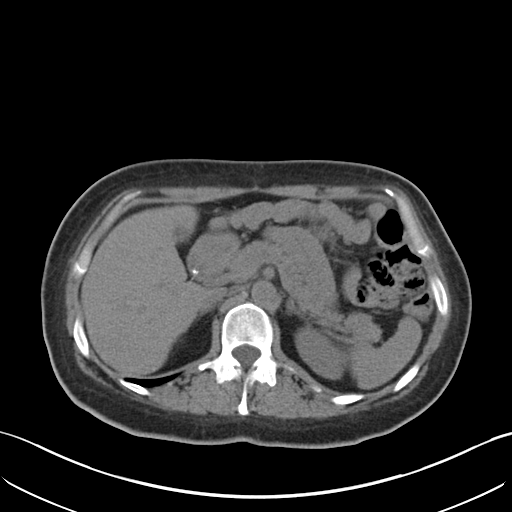
[im 67/86  soft-tissue]
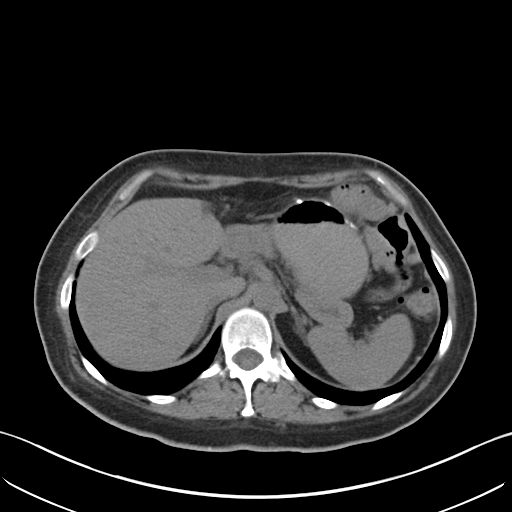
[im 74/86  soft-tissue]
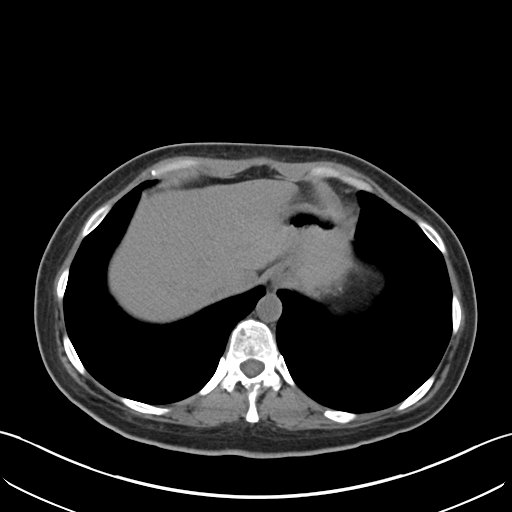
[im 82/86  soft-tissue]
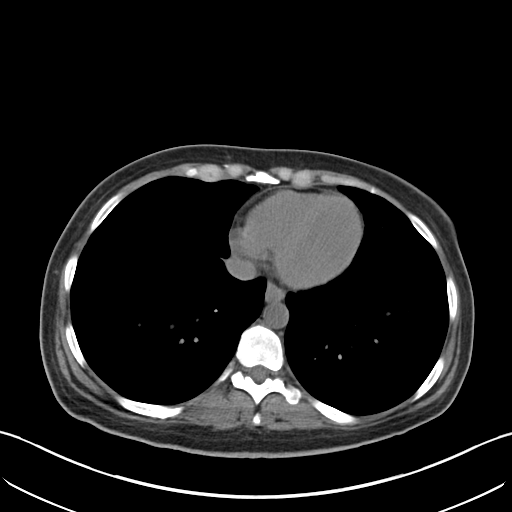

[Series 5: coronal · coronal · 0.67mm/px · 3 of 43 slices shown]
[im 15/43  soft-tissue]
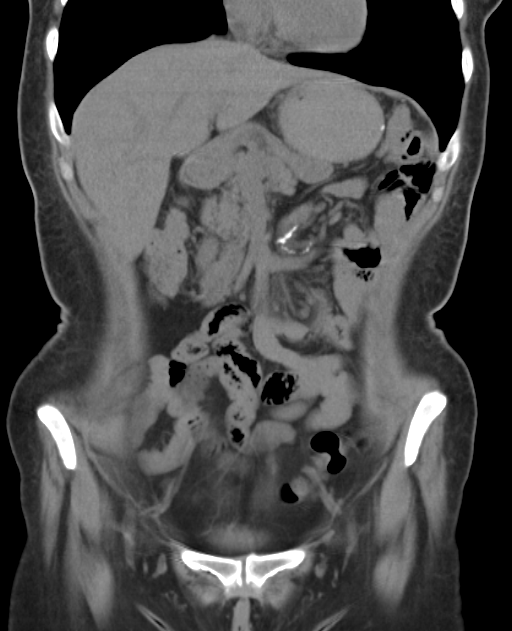
[im 19/43  soft-tissue]
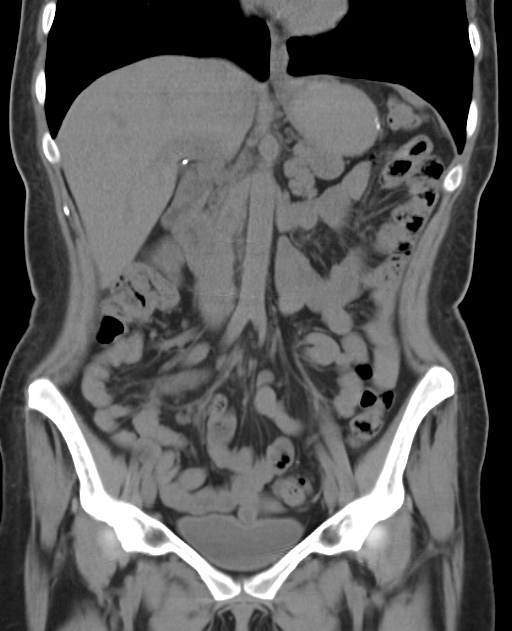
[im 24/43  soft-tissue]
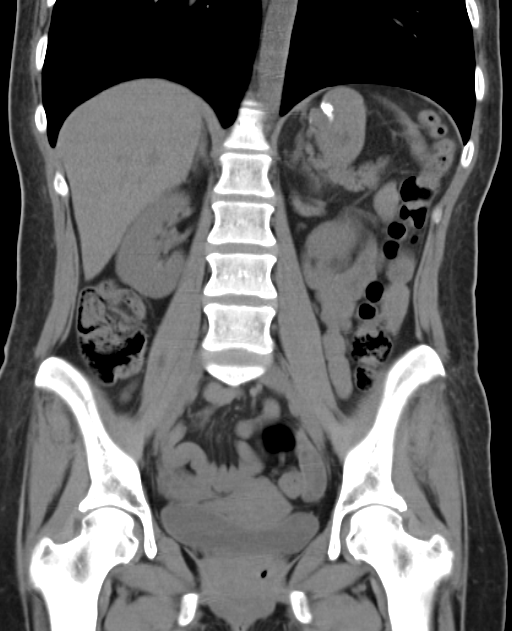

[16 of 46 positions shown; findings below may reference images not displayed]

FINDINGS: Lung bases clear.
Small fat attenuation mass right kidney 13 x 8 mm image 35
consistent with angiomyolipoma.
Remainder of liver, spleen, pancreas, kidneys, and adrenal glands
unremarkable for noncontrast technique.
Prior gastric bypass with distended gastric pouch/reservoir.
Decompressed distal gastric lumen.
Nodular appearing uterus suspect containing leiomyomata.
Bladder, adnexae, and ureters unremarkable.
Large and small bowel loops grossly unremarkable for technique.
No mass, adenopathy, free fluid or inflammatory process.
No hernia or acute bony lesion.
IMPRESSION: Post gastric bypass surgery.
Small angiomyolipoma right kidney.
Probable uterine leiomyomata.
No acute intra abdominal or intrapelvic abnormalities.

## 2012-12-16 IMAGING — US US RENAL
1 series · 14 of 25 positions shown · non-contrast
Comparison: 08/15/2011

CLINICAL DATA: Acute renal failure

RENAL/URINARY TRACT ULTRASOUND COMPLETE

[Series 1: us renal · 0.26mm/px · 14 of 33 slices shown]
[im 1/33]
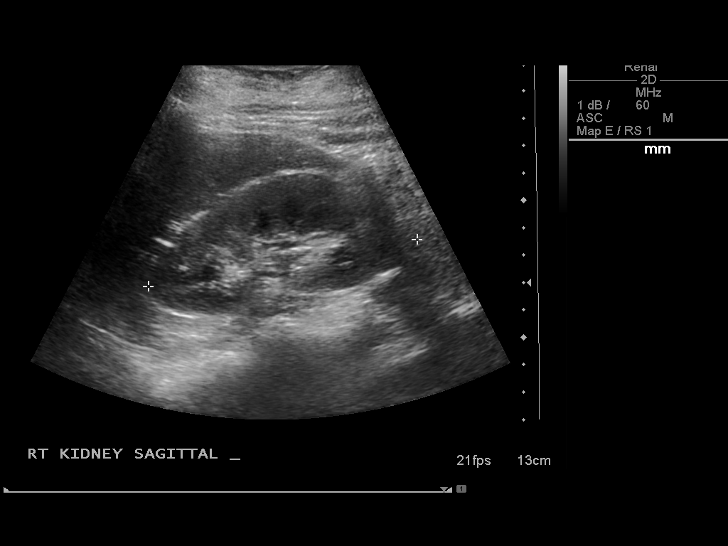
[im 3/33]
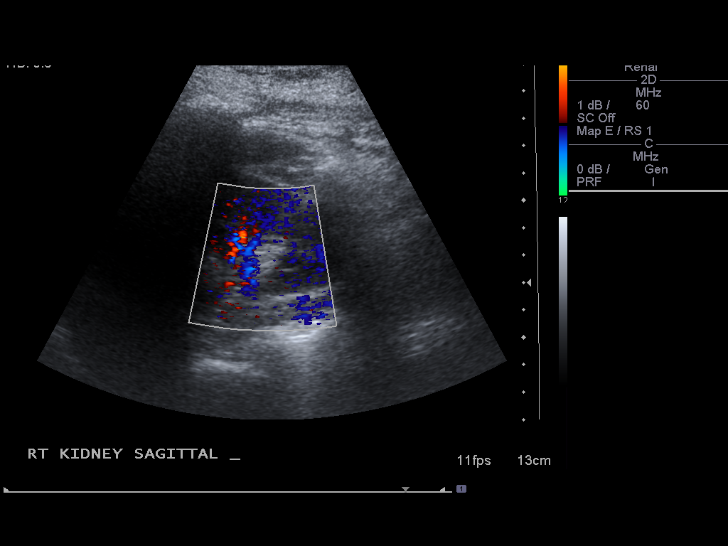
[im 6/33]
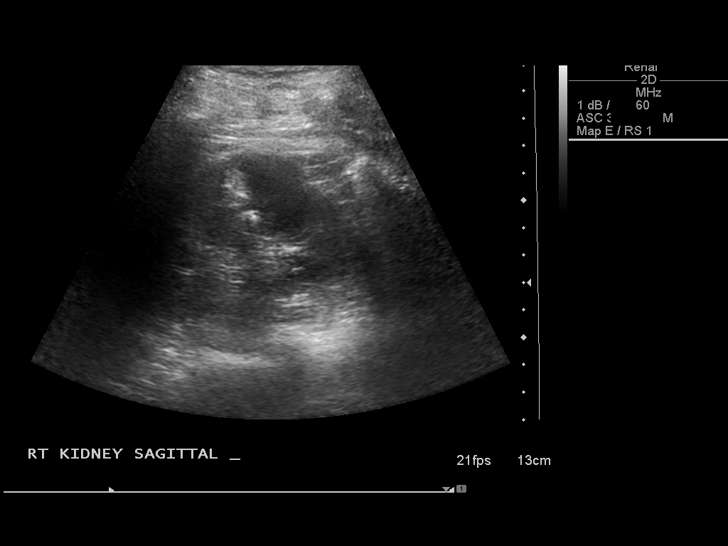
[im 9/33]
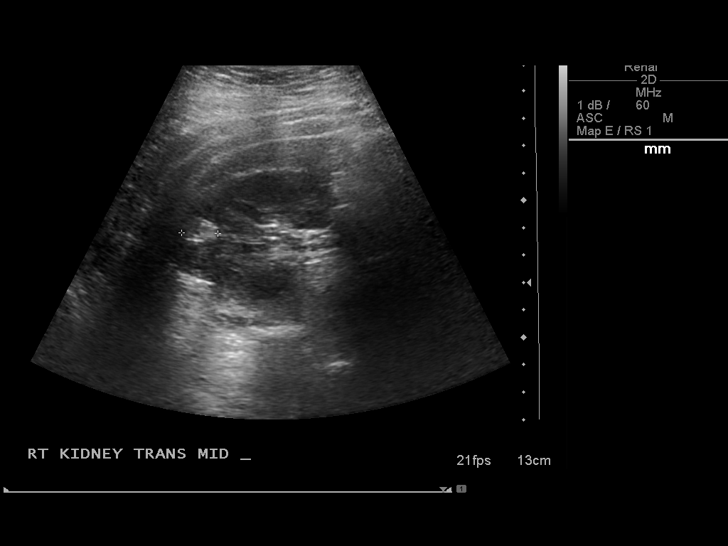
[im 11/33]
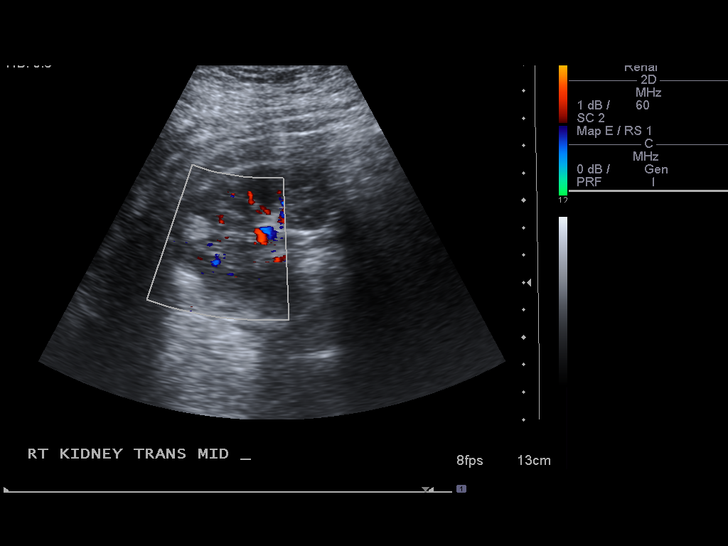
[im 13/33]
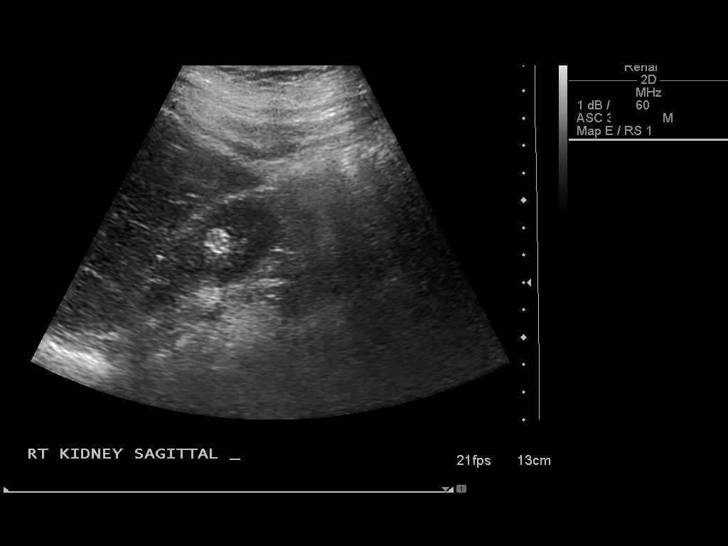
[im 15/33]
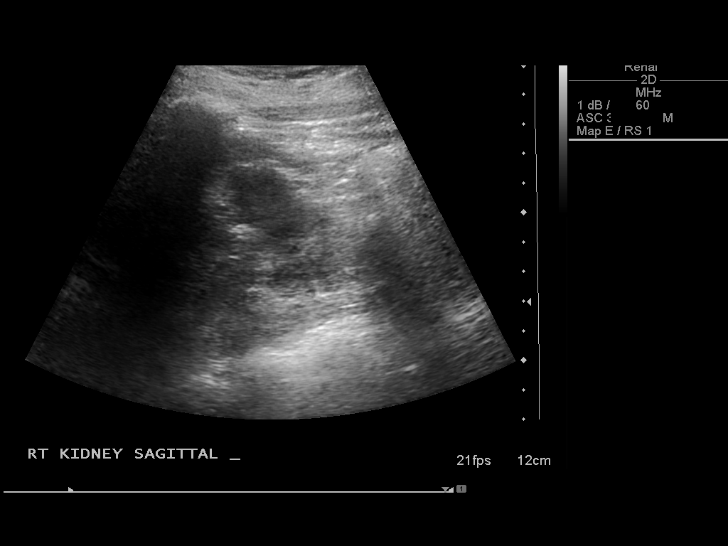
[im 18/33]
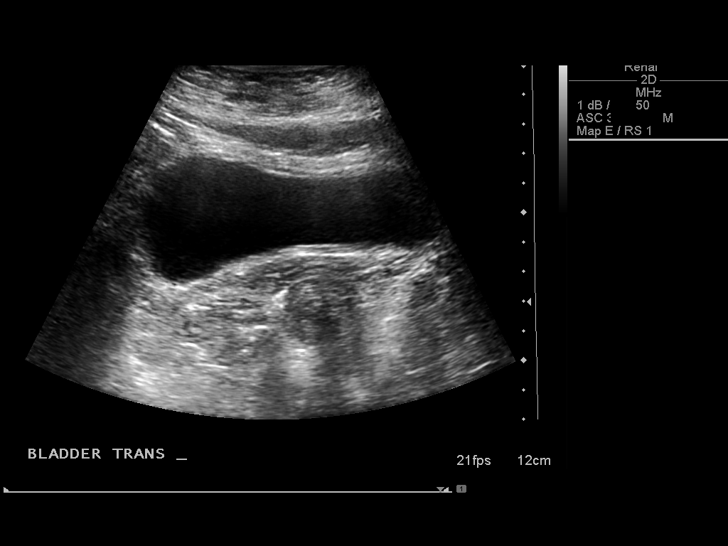
[im 21/33]
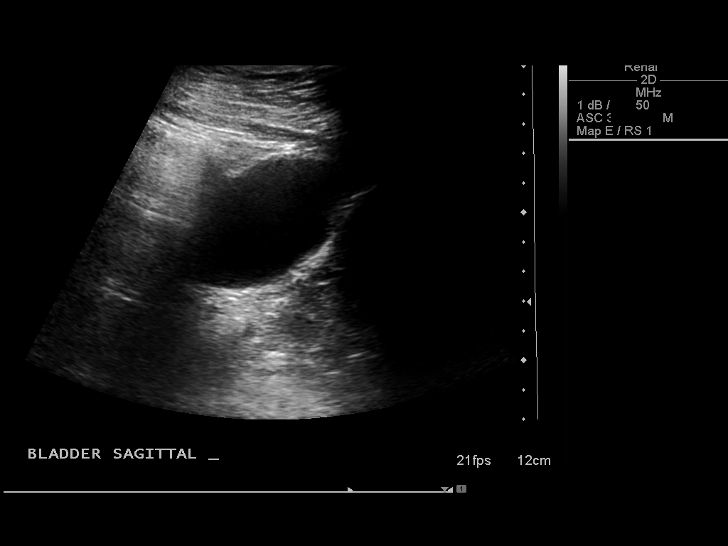
[im 22/33]
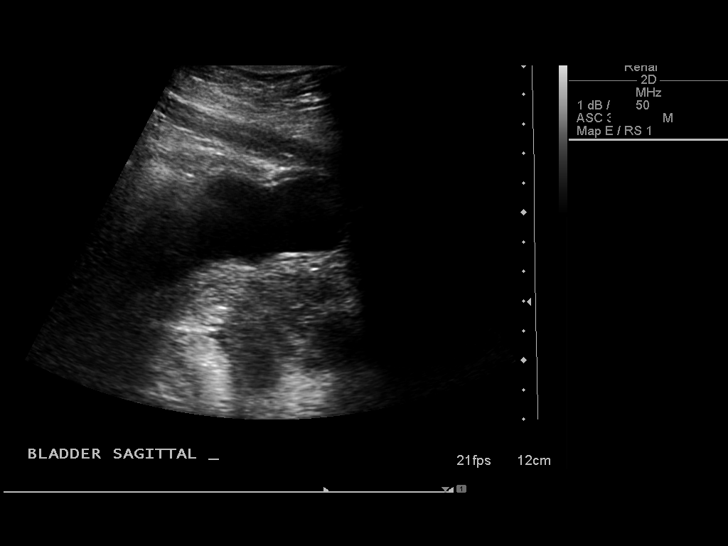
[im 25/33]
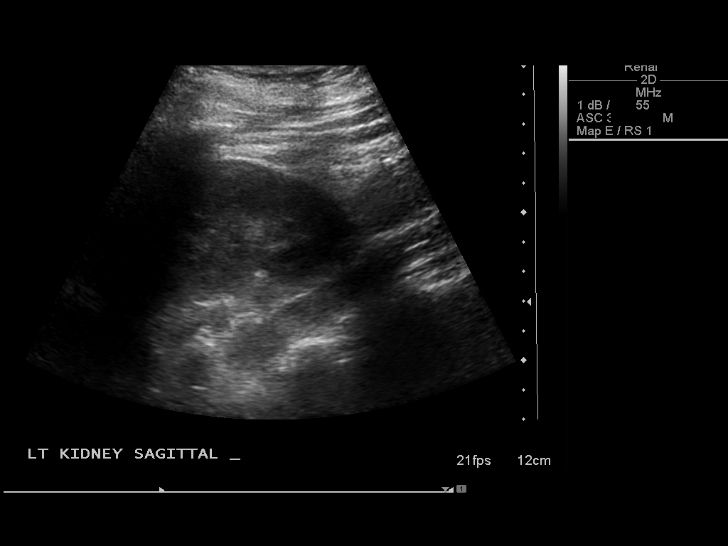
[im 27/33]
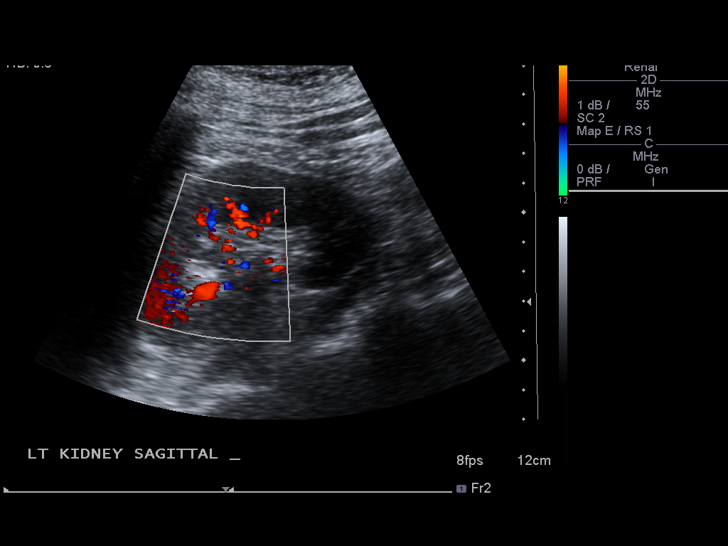
[im 30/33]
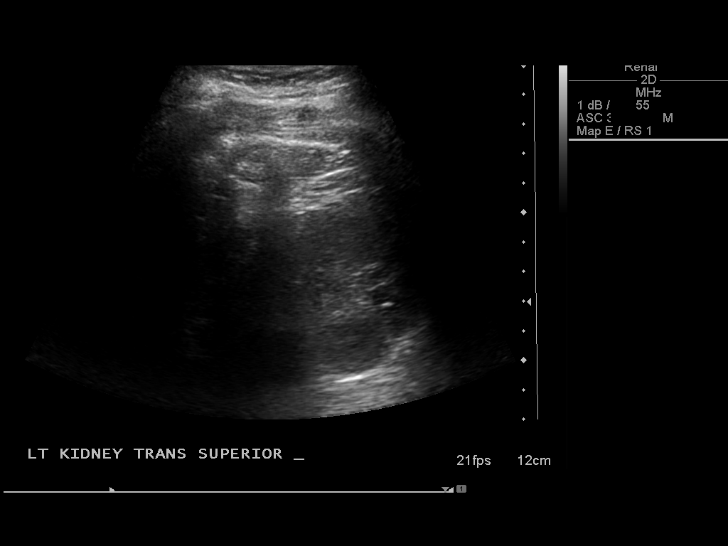
[im 33/33]
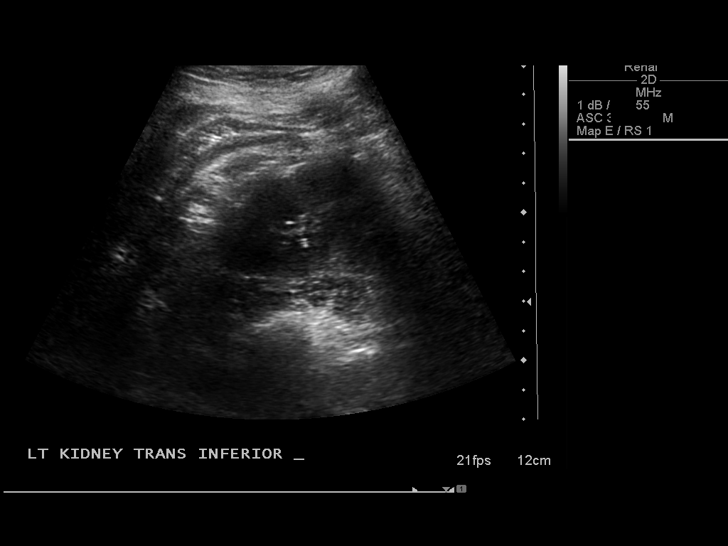

[14 of 25 positions shown; findings below may reference images not displayed]

FINDINGS: Right Kidney:  Measures 10.0 cm.  Normal in size and parenchymal
echogenicity. Right kidney angiomyolipoma is again noted, measuring
1.3 cm.  No evidence of mass or hydronephrosis.

Left Kidney:  Measures 9.7 cm.  Normal in size and parenchymal
echogenicity.  No evidence of mass or hydronephrosis.

Bladder:  Appears normal for degree of bladder distention.
IMPRESSION: 1.  No hydronephrosis.
2.  Right renal angiomyolipoma.

## 2013-01-16 ENCOUNTER — Telehealth (INDEPENDENT_AMBULATORY_CARE_PROVIDER_SITE_OTHER): Payer: Self-pay | Admitting: *Deleted

## 2013-01-16 ENCOUNTER — Telehealth (INDEPENDENT_AMBULATORY_CARE_PROVIDER_SITE_OTHER): Payer: Self-pay

## 2013-01-16 ENCOUNTER — Inpatient Hospital Stay (HOSPITAL_COMMUNITY)
Admission: EM | Admit: 2013-01-16 | Discharge: 2013-01-21 | DRG: 392 | Disposition: A | Discharge status: Home / Self Care | Payer: Managed Care, Other (non HMO) | Attending: General Surgery | Admitting: General Surgery

## 2013-01-16 ENCOUNTER — Emergency Department (HOSPITAL_COMMUNITY): Payer: Managed Care, Other (non HMO)

## 2013-01-16 ENCOUNTER — Encounter (HOSPITAL_COMMUNITY): Payer: Self-pay | Admitting: *Deleted

## 2013-01-16 DIAGNOSIS — Z9089 Acquired absence of other organs: Secondary | ICD-10-CM

## 2013-01-16 DIAGNOSIS — E039 Hypothyroidism, unspecified: Secondary | ICD-10-CM | POA: Diagnosis present

## 2013-01-16 DIAGNOSIS — R112 Nausea with vomiting, unspecified: Secondary | ICD-10-CM

## 2013-01-16 DIAGNOSIS — F329 Major depressive disorder, single episode, unspecified: Secondary | ICD-10-CM | POA: Diagnosis present

## 2013-01-16 DIAGNOSIS — Z880 Allergy status to penicillin: Secondary | ICD-10-CM

## 2013-01-16 DIAGNOSIS — Z91040 Latex allergy status: Secondary | ICD-10-CM

## 2013-01-16 DIAGNOSIS — F411 Generalized anxiety disorder: Secondary | ICD-10-CM | POA: Diagnosis present

## 2013-01-16 DIAGNOSIS — E669 Obesity, unspecified: Secondary | ICD-10-CM | POA: Diagnosis present

## 2013-01-16 DIAGNOSIS — B9789 Other viral agents as the cause of diseases classified elsewhere: Secondary | ICD-10-CM | POA: Diagnosis present

## 2013-01-16 DIAGNOSIS — Z9884 Bariatric surgery status: Secondary | ICD-10-CM

## 2013-01-16 DIAGNOSIS — Z6841 Body Mass Index (BMI) 40.0 and over, adult: Secondary | ICD-10-CM

## 2013-01-16 DIAGNOSIS — E282 Polycystic ovarian syndrome: Secondary | ICD-10-CM | POA: Diagnosis present

## 2013-01-16 DIAGNOSIS — K219 Gastro-esophageal reflux disease without esophagitis: Secondary | ICD-10-CM | POA: Diagnosis present

## 2013-01-16 DIAGNOSIS — Z91041 Radiographic dye allergy status: Secondary | ICD-10-CM

## 2013-01-16 DIAGNOSIS — Z79899 Other long term (current) drug therapy: Secondary | ICD-10-CM

## 2013-01-16 DIAGNOSIS — F3289 Other specified depressive episodes: Secondary | ICD-10-CM | POA: Diagnosis present

## 2013-01-16 DIAGNOSIS — Z888 Allergy status to other drugs, medicaments and biological substances status: Secondary | ICD-10-CM

## 2013-01-16 LAB — URINALYSIS, ROUTINE W REFLEX MICROSCOPIC
Leukocytes, UA: NEGATIVE
Nitrite: NEGATIVE
Specific Gravity, Urine: 1.004 — ABNORMAL LOW (ref 1.005–1.030)
Urobilinogen, UA: 0.2 mg/dL (ref 0.0–1.0)
pH: 6 (ref 5.0–8.0)

## 2013-01-16 LAB — CBC WITH DIFFERENTIAL/PLATELET
Basophils Absolute: 0.1 10*3/uL (ref 0.0–0.1)
Basophils Relative: 1 % (ref 0–1)
Lymphocytes Relative: 31 % (ref 12–46)
Neutro Abs: 4.5 10*3/uL (ref 1.7–7.7)
Platelets: 549 10*3/uL — ABNORMAL HIGH (ref 150–400)
RDW: 17.1 % — ABNORMAL HIGH (ref 11.5–15.5)
WBC: 8.2 10*3/uL (ref 4.0–10.5)

## 2013-01-16 LAB — URINE MICROSCOPIC-ADD ON

## 2013-01-16 LAB — BASIC METABOLIC PANEL
Calcium: 8.8 mg/dL (ref 8.4–10.5)
Chloride: 103 mEq/L (ref 96–112)
Creatinine, Ser: 0.89 mg/dL (ref 0.50–1.10)
GFR calc Af Amer: >90 mL/min (ref >90)
GFR calc non Af Amer: 85 mL/min — ABNORMAL LOW (ref >90)

## 2013-01-16 LAB — POCT PREGNANCY, URINE: Preg Test, Ur: NEGATIVE

## 2013-01-16 MED ORDER — ONDANSETRON HCL 4 MG/2ML IJ SOLN
4.0000 mg | Freq: Once | INTRAMUSCULAR | Status: AC
  Administered 2013-01-16: 4 mg via INTRAVENOUS
  Filled 2013-01-16: qty 2

## 2013-01-16 MED ORDER — MORPHINE SULFATE 4 MG/ML IJ SOLN
INTRAMUSCULAR | Status: AC
  Administered 2013-01-16: 4 mg
  Filled 2013-01-16: qty 1

## 2013-01-16 MED ORDER — ONDANSETRON HCL 4 MG/2ML IJ SOLN
4.0000 mg | Freq: Four times a day (QID) | INTRAMUSCULAR | Status: DC | PRN
  Administered 2013-01-17 – 2013-01-18 (×4): 4 mg via INTRAVENOUS
  Filled 2013-01-16 (×4): qty 2

## 2013-01-16 MED ORDER — POTASSIUM CHLORIDE IN NACL 20-0.9 MEQ/L-% IV SOLN
INTRAVENOUS | Status: DC
  Filled 2013-01-16 (×2): qty 1000

## 2013-01-16 MED ORDER — SODIUM CHLORIDE 0.9 % IV BOLUS (SEPSIS)
1000.0000 mL | Freq: Once | INTRAVENOUS | Status: AC
  Administered 2013-01-16: 1000 mL via INTRAVENOUS

## 2013-01-16 MED ORDER — HYDROMORPHONE HCL PF 1 MG/ML IJ SOLN
1.0000 mg | Freq: Once | INTRAMUSCULAR | Status: AC
  Administered 2013-01-16: 1 mg via INTRAVENOUS
  Filled 2013-01-16: qty 1

## 2013-01-16 MED ORDER — MORPHINE SULFATE 2 MG/ML IJ SOLN
2.0000 mg | INTRAMUSCULAR | Status: DC | PRN
  Administered 2013-01-16 – 2013-01-17 (×3): 2 mg via INTRAVENOUS
  Administered 2013-01-17 – 2013-01-18 (×11): 4 mg via INTRAVENOUS
  Administered 2013-01-18: 2 mg via INTRAVENOUS
  Administered 2013-01-18 – 2013-01-19 (×2): 4 mg via INTRAVENOUS
  Administered 2013-01-19 (×2): 2 mg via INTRAVENOUS
  Administered 2013-01-19 (×2): 4 mg via INTRAVENOUS
  Administered 2013-01-19: 2 mg via INTRAVENOUS
  Administered 2013-01-19 – 2013-01-20 (×8): 4 mg via INTRAVENOUS
  Administered 2013-01-20 (×2): 2 mg via INTRAVENOUS
  Administered 2013-01-21: 4 mg via INTRAVENOUS
  Filled 2013-01-16 (×2): qty 1
  Filled 2013-01-16 (×6): qty 2
  Filled 2013-01-16: qty 1
  Filled 2013-01-16 (×2): qty 2
  Filled 2013-01-16: qty 1
  Filled 2013-01-16 (×3): qty 2
  Filled 2013-01-16: qty 1
  Filled 2013-01-16 (×5): qty 2
  Filled 2013-01-16: qty 1
  Filled 2013-01-16 (×5): qty 2
  Filled 2013-01-16: qty 1
  Filled 2013-01-16 (×2): qty 2
  Filled 2013-01-16: qty 1
  Filled 2013-01-16 (×3): qty 2

## 2013-01-16 MED ORDER — PANTOPRAZOLE SODIUM 40 MG IV SOLR
40.0000 mg | Freq: Two times a day (BID) | INTRAVENOUS | Status: DC
  Administered 2013-01-16 – 2013-01-20 (×9): 40 mg via INTRAVENOUS
  Filled 2013-01-16 (×11): qty 40

## 2013-01-16 MED ORDER — LEVOTHYROXINE SODIUM 100 MCG IV SOLR
50.0000 ug | Freq: Every day | INTRAVENOUS | Status: DC
  Administered 2013-01-17 – 2013-01-20 (×4): 50 ug via INTRAVENOUS
  Filled 2013-01-16 (×6): qty 5

## 2013-01-16 MED ORDER — SODIUM CHLORIDE 0.9 % IV SOLN
Freq: Once | INTRAVENOUS | Status: AC
  Administered 2013-01-16: 23:00:00 via INTRAVENOUS

## 2013-01-16 MED ORDER — METOCLOPRAMIDE HCL 5 MG/ML IJ SOLN
10.0000 mg | Freq: Once | INTRAMUSCULAR | Status: AC
  Administered 2013-01-16: 10 mg via INTRAVENOUS
  Filled 2013-01-16: qty 2

## 2013-01-16 MED ORDER — MORPHINE SULFATE 4 MG/ML IJ SOLN
4.0000 mg | Freq: Once | INTRAMUSCULAR | Status: AC
  Administered 2013-01-16: 4 mg via INTRAVENOUS
  Filled 2013-01-16: qty 1

## 2013-01-16 MED ORDER — SODIUM CHLORIDE 0.9 % IV SOLN
INTRAVENOUS | Status: DC
  Administered 2013-01-17: 500 mL via INTRAVENOUS

## 2013-01-16 MED ORDER — SODIUM CHLORIDE 0.9 % IV SOLN
INTRAVENOUS | Status: DC
  Administered 2013-01-17 – 2013-01-19 (×3): via INTRAVENOUS

## 2013-01-16 MED ORDER — HEPARIN SODIUM (PORCINE) 5000 UNIT/ML IJ SOLN
5000.0000 [IU] | Freq: Three times a day (TID) | INTRAMUSCULAR | Status: DC
  Administered 2013-01-16 – 2013-01-20 (×11): 5000 [IU] via SUBCUTANEOUS
  Filled 2013-01-16 (×17): qty 1

## 2013-01-16 NOTE — Telephone Encounter (Signed)
Spoke to patient regarding nausea & vomiting x5 day's.  Patient advised to go to ED preferably [name] for further work up given history of complications from [employer]  Patient states she lives in [place] and will call our office back to let us know if she plans on going to ED or not.  Patient again advised to go to the ED for further work up per Dr. [name].

## 2013-01-16 NOTE — H&P (Signed)
[name] is an 32 y.o. female.   Chief Complaint: persistent vomiting history of gastric bypass HPI: patient is a very pleasant 32 year old female with a history of laparoscopic Roux-en-Y gastric bypass in 2005 for morbid obesity. She re presented in 2012 with complete gastric outlet obstruction and underwent resection of her gastrojejunostomy laparoscopically. She did very well after that and I have not seen her in some time. She called today complaining of 5 days of persistent nausea and vomiting and intolerance of any oral intake. She probably has been getting some fluids down but vomiting most of the the last couple of days. Prior to 5 days ago she has had occasional vomiting but no more than 2 or 3 times a month by her history. No melena or hematemesis. She denies any significant abdominal pain. She has had quite a bit of weight regain since I saw her a year ago. No fever or chills.  Past Medical History  Diagnosis Date  . Hypothyroidism   . Blood transfusion   . GERD (gastroesophageal reflux disease)   . Headache   . Anemia   . Depression   . Anxiety   . Polycystic ovarian syndrome     diagnosed at very early age  . Abdominal pain     Past Surgical History  Procedure Laterality Date  . Tonsillectomy  1983  . Adenoidectomy    . Gastrojejunostomy  09/06/2011    Procedure: LAPAROSCOPIC GASTROJEJUNOSTOMY;  Surgeon: Mariella Saa, MD;  Location: [facility];  Service: General;  Laterality: N/A;  . Esophagogastroduodenoscopy  09/03/2011    Procedure: ESOPHAGOGASTRODUODENOSCOPY (EGD);  Surgeon: Kandis Cocking, MD;  Location: [facility];  Service: General;  Laterality: N/A;  . Appendectomy  1996  . Cholecystectomy  1998    Family History  Problem Relation Age of Onset  . Cancer Mother     breast  . Other Father     cerebal vasculitis  . Cancer Maternal Grandmother     skin  . Cancer Maternal Grandfather     lung   Social History:  reports that she has never smoked. She  has never used smokeless tobacco. She reports that she does not drink alcohol or use illicit drugs.  Allergies:  Allergies  Allergen Reactions  . Iodine Anaphylaxis  . Shellfish-Derived Products Anaphylaxis  . Adhesive (Tape) Other (See Comments)    Unknown reaction  . Contrast Media (Iodinated Diagnostic Agents) Nausea And Vomiting and Other (See Comments)    headache  . Latex Other (See Comments)    Plastic tape (unknown reaction)  . Topiramate (Topamax) Other (See Comments)    tremors  . Lamictal (Lamotrigine) Rash  . Penicillins Rash     (Not in a hospital admission)  Results for orders placed during the hospital encounter of 01/16/13 (from the past 48 hour(s))  POCT PREGNANCY, URINE     Status: None   Collection Time    01/16/13  3:46 PM      Result Value Range   Preg Test, Ur NEGATIVE  NEGATIVE   Comment:            THE SENSITIVITY OF THIS     METHODOLOGY IS >24 mIU/mL  URINALYSIS, ROUTINE W REFLEX MICROSCOPIC     Status: Abnormal   Collection Time    01/16/13  3:48 PM      Result Value Range   Color, Urine YELLOW  YELLOW   APPearance CLOUDY (*) CLEAR   Specific Gravity, Urine 1.004 (*) 1.005 -  1.030   pH 6.0  5.0 - 8.0   Glucose, UA NEGATIVE  NEGATIVE mg/dL   Hgb urine dipstick LARGE (*) NEGATIVE   Bilirubin Urine NEGATIVE  NEGATIVE   Ketones, ur NEGATIVE  NEGATIVE mg/dL   Protein, ur NEGATIVE  NEGATIVE mg/dL   Urobilinogen, UA 0.2  0.0 - 1.0 mg/dL   Nitrite NEGATIVE  NEGATIVE   Leukocytes, UA NEGATIVE  NEGATIVE  URINE MICROSCOPIC-ADD ON     Status: Abnormal   Collection Time    01/16/13  3:48 PM      Result Value Range   Squamous Epithelial / LPF FEW (*) RARE   Bacteria, UA RARE  RARE  BASIC METABOLIC PANEL     Status: Abnormal   Collection Time    01/16/13  4:35 PM      Result Value Range   Sodium 136  135 - 145 mEq/L   Potassium 5.1  3.5 - 5.1 mEq/L   Comment: MODERATE HEMOLYSIS     HEMOLYSIS AT THIS LEVEL MAY AFFECT RESULT   Chloride 103  96 -  112 mEq/L   CO2 23  19 - 32 mEq/L   Glucose, Bld 86  70 - 99 mg/dL   BUN 11  6 - 23 mg/dL   Creatinine, Ser 1.61  0.50 - 1.10 mg/dL   Calcium 8.8  8.4 - 09.6 mg/dL   GFR calc non Af Amer 85 (*) >90 mL/min   GFR calc Af Amer >90  >90 mL/min   Comment:            The eGFR has been calculated     using the CKD EPI equation.     This calculation has not been     validated in all clinical     situations.     eGFR's persistently     <90 mL/min signify     possible Chronic Kidney Disease.  CBC WITH DIFFERENTIAL     Status: Abnormal   Collection Time    01/16/13  4:35 PM      Result Value Range   WBC 8.2  4.0 - 10.5 K/uL   RBC 4.07  3.87 - 5.11 MIL/uL   Hemoglobin 9.5 (*) 12.0 - 15.0 g/dL   HCT 04.5 (*) 40.9 - 81.1 %   MCV 77.4 (*) 78.0 - 100.0 fL   MCH 23.3 (*) 26.0 - 34.0 pg   MCHC 30.2  30.0 - 36.0 g/dL   RDW 91.4 (*) 78.2 - 95.6 %   Platelets 549 (*) 150 - 400 K/uL   Neutrophils Relative 56  43 - 77 %   Neutro Abs 4.5  1.7 - 7.7 K/uL   Lymphocytes Relative 31  12 - 46 %   Lymphs Abs 2.6  0.7 - 4.0 K/uL   Monocytes Relative 9  3 - 12 %   Monocytes Absolute 0.7  0.1 - 1.0 K/uL   Eosinophils Relative 3  0 - 5 %   Eosinophils Absolute 0.3  0.0 - 0.7 K/uL   Basophils Relative 1  0 - 1 %   Basophils Absolute 0.1  0.0 - 0.1 K/uL   Dg Abd Acute W/chest  01/16/2013  *RADIOLOGY REPORT*  Clinical Data: Previous gastric bypass  ACUTE ABDOMEN SERIES (ABDOMEN 2 VIEW & CHEST 1 VIEW)  Comparison: Upper GI 09/07/2011  Findings: Normal cardiac silhouette.  No effusion, infiltrate, pneumothorax.  No free air beneath hemidiaphragms.  There is a moderate volume stool in the ascending colon.  There  is stool in the sigmoid colon as well as gas.  No pathologically dilated loops of large or small bowel.  The cecum does measure up to 7.5 cm. Small amount gas in the rectum.  IMPRESSION:  1.  Moderate volume stool within the colon suggests  constipation. 2.  Lung bases are clear.   Original Report  Authenticated By: Genevive Bi, M.D.     Review of Systems  Constitutional: Negative for fever and chills.  Respiratory: Negative.   Cardiovascular: Negative.   Gastrointestinal: Positive for nausea and vomiting. Negative for abdominal pain, diarrhea, constipation, blood in stool and melena.  Genitourinary: Negative.   Psychiatric/Behavioral: Positive for depression. The patient is nervous/anxious.     Blood pressure 124/71, pulse 95, temperature 98.1 F (36.7 C), temperature source Oral, resp. rate 20, last menstrual period 01/01/2013, SpO2 99.00%. Physical Exam  General: Obese but otherwise well-appearing Caucasian female in no distress Skin: Warm and dry without rash or infection HEENT: No palpable masses or thyromegaly. Sclera nonicteric. Oropharynx clear mucous membranes moist Lymph nodes: No cervical, supraclavicular nodes palpable Lungs: Clear equal breath sounds without wheezing Cardiac: Regular rate and rhythm. No murmurs. No edema. Abdomen: Well-healed laparoscopic incisions. Bowel sounds are normoactive. Soft and nontender. No discernible masses. No hernias appreciated. Extremities: No joint swelling or edema Neurologic: She is alert and fully oriented. Affect appropriate. No gross motor deficits.  Assessment/Plan 32 year old female who has significant history of laparoscopic Roux-en-Y gastric bypass in 2005 and subsequent resection of her gastrojejunostomy with re anastomosis in 2012 due to complete gastric outlet obstruction. She had been doing well until 5 days ago although she has had significant weight regain. She now presents with persistent intolerance of oral intake. I believe we need to rule out recurrent gastric outlet obstruction or marginal ulcer. Her symptoms seem somewhat acute for this. She could have gastritis or a persistent viral syndrome. She has not had any abdominal pain or tenderness to suggest bowel obstruction and her plain films are negative. The  patient will be admitted and treated symptomatically tonight. I believe the most helpful initial study would be upper endoscopy and I have contacted GI medicine in this regard.  Treasa Bradshaw T 01/16/2013, 8:27 PM

## 2013-01-16 NOTE — ED Notes (Signed)
Pt reports nausea with vomiting x 5 days.  Reports vomiting x 4 today.  Denies any abd pain or diarrhea.  Pt reports having a gastric bypass x 1.5 years ago-reports having complications where her "stoma had ulcerated" which made her have same sxs.

## 2013-01-16 NOTE — Telephone Encounter (Signed)
Patient called in to report nausea and vomiting for the last 5 days of everything she tries to eat.  Patient reports her surgery was 12/2011.  Awaiting recommendations from [facility] MD.

## 2013-01-16 NOTE — ED Provider Notes (Signed)
History     CSN: [id number]  Arrival date & time 01/16/13  1443   First MD Initiated Contact with Patient 01/16/13 1555      Chief Complaint  Patient presents with  . Emesis  . Nausea    (Consider location/radiation/quality/duration/timing/severity/associated sxs/prior treatment) HPI Comments: Pt presents to the ED for constant nausea and non-bloody vomiting x 5 days.  Pt is w/p gastric bypass in 2005 with revision in 2012 after her gastric outlet became ulcerated and scarred shut.  Pt states these are the same sx she had before the revision.  BM have been normal.  Has been able to keep down very small amounts of liquid but no solid foods.  Denies any chest pain, SOB, abdominal pain, diarrhea, dizziness, or weakness.  The history is provided by the patient.    Past Medical History  Diagnosis Date  . Hypothyroidism   . Blood transfusion   . GERD (gastroesophageal reflux disease)   . Headache   . Anemia   . Depression   . Anxiety   . Polycystic ovarian syndrome     diagnosed at very early age  . Abdominal pain     Past Surgical History  Procedure Laterality Date  . Tonsillectomy  1983  . Adenoidectomy    . Gastrojejunostomy  09/06/2011    Procedure: LAPAROSCOPIC GASTROJEJUNOSTOMY;  Surgeon: Mariella Saa, MD;  Location: [facility];  Service: General;  Laterality: N/A;  . Esophagogastroduodenoscopy  09/03/2011    Procedure: ESOPHAGOGASTRODUODENOSCOPY (EGD);  Surgeon: Kandis Cocking, MD;  Location: [facility];  Service: General;  Laterality: N/A;  . Appendectomy  1996  . Cholecystectomy  1998    Family History  Problem Relation Age of Onset  . Cancer Mother     breast  . Other Father     cerebal vasculitis  . Cancer Maternal Grandmother     skin  . Cancer Maternal Grandfather     lung    History  Substance Use Topics  . Smoking status: Never Smoker   . Smokeless tobacco: Never Used  . Alcohol Use: No    OB History   Grav Para Term Preterm Abortions TAB  SAB Ect Mult Living                  Review of Systems  Gastrointestinal: Positive for nausea and vomiting.  All other systems reviewed and are negative.    Allergies  Iodine; Shellfish-derived products; Adhesive; Contrast media; Latex; Topiramate; Lamictal; and Penicillins  Home Medications   Current Outpatient Rx  Name  Route  Sig  Dispense  Refill  . Cholecalciferol (VITAMIN D3) 5000 UNITS CAPS   Oral   Take 1 tablet by mouth daily.         Marland Kitchen imipramine (TOFRANIL-PM) 150 MG capsule   Oral   Take 150 mg by mouth at bedtime.         Marland Kitchen levothyroxine (SYNTHROID, LEVOTHROID) 100 MCG tablet   Oral   Take 100 mcg by mouth daily.         . metFORMIN (GLUCOPHAGE) 500 MG tablet   Oral   Take 500 mg by mouth daily.           . Multiple Vitamins-Minerals (MULTIVITAMINS THER. W/MINERALS) TABS   Oral   Take 1 tablet by mouth daily.           . QUEtiapine (SEROQUEL) 400 MG tablet   Oral   Take 400 mg by mouth at bedtime.         Marland Kitchen  EXPIRED: pantoprazole (PROTONIX) 40 MG tablet   Oral   Take 1 tablet (40 mg total) by mouth daily.   30 tablet   1     BP 127/53  Pulse 99  Temp(Src) 97 F (36.1 C) (Oral)  Resp 18  SpO2 99%  LMP 01/01/2013  Physical Exam  Nursing note and vitals reviewed. Constitutional: She is oriented to person, place, and time.  Obese  HENT:  Head: Normocephalic and atraumatic.  Mouth/Throat: Uvula is midline, oropharynx is clear and moist and mucous membranes are normal. No oropharyngeal exudate, posterior oropharyngeal edema, posterior oropharyngeal erythema or tonsillar abscesses.  Eyes: Conjunctivae and EOM are normal.  Neck: Normal range of motion. Neck supple.  Cardiovascular: Normal rate, regular rhythm and normal heart sounds.   Pulmonary/Chest: Effort normal and breath sounds normal. She has no wheezes.  Abdominal: Soft. Bowel sounds are normal. There is no tenderness. There is no guarding.  Musculoskeletal: Normal range of  motion. She exhibits no edema.  Lymphadenopathy:    She has no cervical adenopathy.  Neurological: She is alert and oriented to person, place, and time. No cranial nerve deficit.  Skin: Skin is warm and dry.  Psychiatric: She has a normal mood and affect.    ED Course  Procedures (including critical care time)  Labs Reviewed  URINALYSIS, ROUTINE W REFLEX MICROSCOPIC - Abnormal; Notable for the following:    APPearance CLOUDY (*)    Specific Gravity, Urine 1.004 (*)    Hgb urine dipstick LARGE (*)    All other components within normal limits  BASIC METABOLIC PANEL - Abnormal; Notable for the following:    GFR calc non Af Amer 85 (*)    All other components within normal limits  CBC WITH DIFFERENTIAL - Abnormal; Notable for the following:    Hemoglobin 9.5 (*)    HCT 31.5 (*)    MCV 77.4 (*)    MCH 23.3 (*)    RDW 17.1 (*)    Platelets 549 (*)    All other components within normal limits  URINE MICROSCOPIC-ADD ON - Abnormal; Notable for the following:    Squamous Epithelial / LPF FEW (*)    All other components within normal limits  URINE CULTURE  POCT PREGNANCY, URINE   No results found.   1. Nausea and vomiting in adult   2. History of Roux-en-Y gastric bypass       MDM   32 y.o. Female presenting to the ED for constant nausea and non-bloody vomiting x 5 days.  Pt is s/p gastric bypass in 2005 with revision in 2012 after her gastric outlet became ulcerated and scarred shut.  Labs as above.  Some resolution of sx with IVF, pain meds, and anti-emetics.  Bariatric surgery was consulted, they will evaluate her in the ED.  Signed out to Fayrene Helper PA-C for disposition.       Garlon Hatchet, PA-C 01/18/13 [phone]

## 2013-01-16 NOTE — ED Notes (Signed)
Patient reports nausea has decreased but is still present.

## 2013-01-16 NOTE — Telephone Encounter (Signed)
Pt returned call to [facility] to let Dr [name] know she is on her way to [facility] per his recommendation. Pt coming  from [facility] and will take 1.5 to 2 hrs to arrive. Christy and Dr [name] notified.

## 2013-01-17 ENCOUNTER — Encounter (HOSPITAL_COMMUNITY)
Admission: EM | Disposition: A | Discharge status: Home / Self Care | Payer: Self-pay | Source: Home / Self Care | Attending: General Surgery

## 2013-01-17 ENCOUNTER — Encounter (HOSPITAL_COMMUNITY): Payer: Self-pay | Admitting: *Deleted

## 2013-01-17 DIAGNOSIS — R112 Nausea with vomiting, unspecified: Principal | ICD-10-CM

## 2013-01-17 DIAGNOSIS — Z9884 Bariatric surgery status: Secondary | ICD-10-CM

## 2013-01-17 HISTORY — PX: ESOPHAGOGASTRODUODENOSCOPY: SHX5428

## 2013-01-17 LAB — GLUCOSE, CAPILLARY
Glucose-Capillary: 86 mg/dL (ref 70–99)
Glucose-Capillary: 89 mg/dL (ref 70–99)

## 2013-01-17 SURGERY — EGD (ESOPHAGOGASTRODUODENOSCOPY)
Anesthesia: Moderate Sedation

## 2013-01-17 MED ORDER — FENTANYL CITRATE 0.05 MG/ML IJ SOLN
INTRAMUSCULAR | Status: AC
  Filled 2013-01-17: qty 2

## 2013-01-17 MED ORDER — DIPHENHYDRAMINE HCL 50 MG/ML IJ SOLN
INTRAMUSCULAR | Status: AC
  Filled 2013-01-17: qty 1

## 2013-01-17 MED ORDER — MIDAZOLAM HCL 10 MG/2ML IJ SOLN
INTRAMUSCULAR | Status: AC
  Filled 2013-01-17: qty 2

## 2013-01-17 MED ORDER — BUTAMBEN-TETRACAINE-BENZOCAINE 2-2-14 % EX AERO
INHALATION_SPRAY | CUTANEOUS | Status: DC | PRN
  Administered 2013-01-17: 2 via TOPICAL

## 2013-01-17 MED ORDER — MIDAZOLAM HCL 10 MG/2ML IJ SOLN
INTRAMUSCULAR | Status: DC | PRN
  Administered 2013-01-17: 2 mg via INTRAVENOUS
  Administered 2013-01-17: 2.5 mg via INTRAVENOUS
  Administered 2013-01-17: 1 mg via INTRAVENOUS
  Administered 2013-01-17: 2 mg via INTRAVENOUS
  Administered 2013-01-17: 2.5 mg via INTRAVENOUS

## 2013-01-17 MED ORDER — IMIPRAMINE PAMOATE 150 MG PO CAPS
150.0000 mg | ORAL_CAPSULE | Freq: Every day | ORAL | Status: DC
  Filled 2013-01-17: qty 1

## 2013-01-17 MED ORDER — FENTANYL CITRATE 0.05 MG/ML IJ SOLN
INTRAMUSCULAR | Status: DC | PRN
  Administered 2013-01-17 (×4): 25 ug via INTRAVENOUS

## 2013-01-17 MED ORDER — QUETIAPINE FUMARATE 400 MG PO TABS
400.0000 mg | ORAL_TABLET | Freq: Every day | ORAL | Status: DC
  Administered 2013-01-17 – 2013-01-20 (×4): 400 mg via ORAL
  Filled 2013-01-17 (×6): qty 1

## 2013-01-17 MED ORDER — ACETAMINOPHEN 325 MG PO TABS: 650.0000 mg | ORAL_TABLET | ORAL | Status: DC | PRN

## 2013-01-17 MED ORDER — DIPHENHYDRAMINE HCL 50 MG/ML IJ SOLN
INTRAMUSCULAR | Status: DC | PRN
  Administered 2013-01-17: 25 mg via INTRAVENOUS

## 2013-01-17 NOTE — Consult Note (Signed)
[facility] Consultation  Referring Provider: Dr. [name], MD, CCS Primary Care Physician:  [name], MD Primary Gastroenterologist:  None  Reason for Consultation:  N/V  HPI: [name] is a 32 y.o. female with past medical history of morbid obesity status post Roux-en-Y gastric bypass in 2005 with revision in 2012 for what sounded like stenosis at the gastrojejunostomy, hypothyroidism, PCOS, who presented yesterday with 5 days of intractable nausea and vomiting with very little by mouth intake. Symptoms seemed to start somewhat abruptly about 5 days ago and have not been associated with abdominal pain. She's also had no diarrhea. There is no report of melena, hematochezia, or hematemesis.  She denies heartburn, dysphagia or odynophagia. She's been able to take very little by mouth the tolerating some liquids, no solids.  No fevers or chills.  She denies dyspnea or chest pain.   Past Medical History  Diagnosis Date  . Hypothyroidism   . Blood transfusion   . GERD (gastroesophageal reflux disease)   . Headache   . Anemia   . Depression   . Anxiety   . Polycystic ovarian syndrome     diagnosed at very early age  . Abdominal pain     Past Surgical History  Procedure Laterality Date  . Tonsillectomy  1983  . Adenoidectomy    . Gastrojejunostomy  09/06/2011    Procedure: LAPAROSCOPIC GASTROJEJUNOSTOMY;  Surgeon: Mariella Saa, MD;  Location: [facility];  Service: General;  Laterality: N/A;  . Esophagogastroduodenoscopy  09/03/2011    Procedure: ESOPHAGOGASTRODUODENOSCOPY (EGD);  Surgeon: Kandis Cocking, MD;  Location: [facility];  Service: General;  Laterality: N/A;  . Appendectomy  1996  . Cholecystectomy  1998    Prior to Admission medications   Medication Sig Start Date End Date Taking? Authorizing Provider  Cholecalciferol (VITAMIN D3) 5000 UNITS CAPS Take 1 tablet by mouth daily.   Yes Historical Provider, MD  imipramine (TOFRANIL-PM) 150 MG capsule Take  150 mg by mouth at bedtime.   Yes Historical Provider, MD  levothyroxine (SYNTHROID, LEVOTHROID) 100 MCG tablet Take 100 mcg by mouth daily.   Yes Historical Provider, MD  metFORMIN (GLUCOPHAGE) 500 MG tablet Take 500 mg by mouth daily.     Yes Historical Provider, MD  Multiple Vitamins-Minerals (MULTIVITAMINS THER. W/MINERALS) TABS Take 1 tablet by mouth daily.     Yes Historical Provider, MD  QUEtiapine (SEROQUEL) 400 MG tablet Take 400 mg by mouth at bedtime.   Yes Historical Provider, MD  pantoprazole (PROTONIX) 40 MG tablet Take 1 tablet (40 mg total) by mouth daily. 11/21/11 11/20/12  Mariella Saa, MD    Current Facility-Administered Medications  Medication Dose Route Frequency Provider Last Rate Last Dose  . 0.9 %  sodium chloride infusion   Intravenous Continuous Carie Caddy Shadie Sweatman, MD      . 0.9 %  sodium chloride infusion   Intravenous Continuous Mariella Saa, MD      . heparin injection 5,000 Units  5,000 Units Subcutaneous Q8H Mariella Saa, MD   5,000 Units at 01/16/13 2233  . levothyroxine (SYNTHROID, LEVOTHROID) injection 50 mcg  50 mcg Intravenous Daily Mariella Saa, MD      . morphine 2 MG/ML injection 2-4 mg  2-4 mg Intravenous Q2H PRN Mariella Saa, MD   4 mg at 01/17/13 0602  . ondansetron (ZOFRAN) injection 4 mg  4 mg Intravenous Q6H PRN Mariella Saa, MD   4 mg at 01/17/13 0243  . pantoprazole (PROTONIX) injection  40 mg  40 mg Intravenous Q12H Mariella Saa, MD   40 mg at 01/16/13 2233    Allergies as of 01/16/2013 - Review Complete 01/16/2013  Allergen Reaction Noted  . Iodine Anaphylaxis 08/29/2011  . Shellfish-derived products Anaphylaxis 08/29/2011  . Adhesive (tape) Other (See Comments) 08/29/2011  . Contrast media (iodinated diagnostic agents) Nausea And Vomiting and Other (See Comments) 08/29/2011  . Latex Other (See Comments) 08/29/2011  . Topiramate (topamax) Other (See Comments) 08/29/2011  . Lamictal (lamotrigine) Rash  01/16/2013  . Penicillins Rash 08/29/2011    Family History  Problem Relation Age of Onset  . Cancer Mother     breast  . Other Father     cerebal vasculitis  . Cancer Maternal Grandmother     skin  . Cancer Maternal Grandfather     lung    History   Social History  . Marital Status: Married    Spouse Name: N/A    Number of Children: N/A  . Years of Education: N/A   Occupational History  . Not on file.   Social History Main Topics  . Smoking status: Never Smoker   . Smokeless tobacco: Never Used  . Alcohol Use: No  . Drug Use: No  . Sexually Active: Not on file   Other Topics Concern  . Not on file   Social History Narrative  . No narrative on file    Review of Systems: As per history of present illness, otherwise negative  Physical Exam: Vital signs in last 24 hours: Temp:  [97 F (36.1 C)-98.7 F (37.1 C)] 98.4 F (36.9 C) (03/22 0815) Pulse Rate:  [84-99] 94 (03/22 0820) Resp:  [10-20] 15 (03/22 0820) BP: (113-151)/(53-89) 151/89 mmHg (03/22 0820) SpO2:  [95 %-99 %] 97 % (03/22 0820) Weight:  [310 lb 6.5 oz (140.8 kg)] 310 lb 6.5 oz (140.8 kg) (03/21 2146) Last BM Date: 01/15/13 Gen: awake, alert, NAD HEENT: anicteric, op clear CV: RRR, no mrg Pulm: CTA b/l Abd: soft, obese, NT/ND, +BS throughout Ext: no c/c/e Neuro: nonfocal   Intake/Output from previous day:   Intake/Output this shift:    Lab Results:  Recent Labs  01/16/13 1635  WBC 8.2  HGB 9.5*  HCT 31.5*  PLT 549*   BMET  Recent Labs  01/16/13 1635  NA 136  K 5.1  CL 103  CO2 23  GLUCOSE 86  BUN 11  CREATININE 0.89  CALCIUM 8.8    Studies/Results: Dg Abd Acute W/chest  01/16/2013  *RADIOLOGY REPORT*  Clinical Data: Previous gastric bypass  ACUTE ABDOMEN SERIES (ABDOMEN 2 VIEW & CHEST 1 VIEW)  Comparison: Upper GI 09/07/2011  Findings: Normal cardiac silhouette.  No effusion, infiltrate, pneumothorax.  No free air beneath hemidiaphragms.  There is a moderate  volume stool in the ascending colon.  There is stool in the sigmoid colon as well as gas.  No pathologically dilated loops of large or small bowel.  The cecum does measure up to 7.5 cm. Small amount gas in the rectum.  IMPRESSION:  1.  Moderate volume stool within the colon suggests  constipation. 2.  Lung bases are clear.   Original Report Authenticated By: Genevive Bi, M.D.      Previous Endoscopies: 2012 - Dr. Ezzard Standing -- gastric pouch but unable to identify the gastrojejunostomy  Impression/ Recommendations: 32 y.o. female with past medical history of morbid obesity status post Roux-en-Y gastric bypass in 2005 with revision in 2012 for what sounded like stenosis at  the gastrojejunostomy, hypothyroidism, PCOS, who presented yesterday with 5 days of intractable nausea and vomiting with very little by mouth intake.  1.  N/V/history of Roux-en-Y anatomy with subsequent revision of gastrojejunostomy -- given the patient's symptoms I agree that EGD as the most appropriate step to determine an etiology to her nausea and vomiting. She could have an anastomotic ulcer causing gastric outlet obstruction.  Stenosis of the gastrojejunostomy is felt unlikely given the acuity of symptoms. It is possible that a viral syndrome could be causing delayed gastric emptying/gastroparesis. Either way upper endoscopy should help sort this out. The nature of the procedure, as well as the risks, benefits, and alternatives were carefully and thoroughly reviewed with the patient. Ample time for discussion and questions allowed. The patient understood, was satisfied, and agreed to proceed.      LOS: 1 day   Marcio Hoque M  01/17/2013, 8:37 AM

## 2013-01-17 NOTE — Progress Notes (Signed)
Patient ID: [name], female   DOB: 1981/10/12, 32 y.o.   MRN: [id number] Day of Surgery  Subjective: Some nausea but tolerating fluids without vomiting.  No pain EGD was negative, no stenosis, ulcer or other abnormalities  Objective: Vital signs in last 24 hours: Temp:  [97 F (36.1 C)-98.7 F (37.1 C)] 98.2 F (36.8 C) (03/22 0910) Pulse Rate:  [84-103] 103 (03/22 0925) Resp:  [7-22] 22 (03/22 0925) BP: (113-157)/(53-91) 134/83 mmHg (03/22 0925) SpO2:  [92 %-100 %] 94 % (03/22 0925) Weight:  [310 lb 6.5 oz (140.8 kg)] 310 lb 6.5 oz (140.8 kg) (03/21 2146) Last BM Date: 01/15/13  Intake/Output from previous day:   Intake/Output this shift: Total I/O In: 300 [I.V.:300] Out: -   General appearance: alert, cooperative and no distress GI: normal findings: soft, non-tender  Lab Results:   Recent Labs  01/16/13 1635  WBC 8.2  HGB 9.5*  HCT 31.5*  PLT 549*   BMET  Recent Labs  01/16/13 1635  NA 136  K 5.1  CL 103  CO2 23  GLUCOSE 86  BUN 11  CREATININE 0.89  CALCIUM 8.8     Studies/Results: Dg Abd Acute W/chest  01/16/2013  *RADIOLOGY REPORT*  Clinical Data: Previous gastric bypass  ACUTE ABDOMEN SERIES (ABDOMEN 2 VIEW & CHEST 1 VIEW)  Comparison: Upper GI 09/07/2011  Findings: Normal cardiac silhouette.  No effusion, infiltrate, pneumothorax.  No free air beneath hemidiaphragms.  There is a moderate volume stool in the ascending colon.  There is stool in the sigmoid colon as well as gas.  No pathologically dilated loops of large or small bowel.  The cecum does measure up to 7.5 cm. Small amount gas in the rectum.  IMPRESSION:  1.  Moderate volume stool within the colon suggests  constipation. 2.  Lung bases are clear.   Original Report Authenticated By: Genevive Bi, M.D.     Anti-infectives: Anti-infectives   None      Assessment/Plan: s/p Procedure(s): ESOPHAGOGASTRODUODENOSCOPY (EGD) N/V, ? Prolonged viral illness?  EGD neg. Seems a little  better today Observe on liquid diet today.  CT if recurrent vomiting   LOS: 1 day    Kden Wagster T 01/17/2013

## 2013-01-17 NOTE — Op Note (Signed)
[facility] [address] [place], [address]   ENDOSCOPY PROCEDURE REPORT  PATIENT: Kristina, Ellison  MR#: [id number] BIRTHDATE: May 04, 1981 , 31  yrs. old GENDER: Female ENDOSCOPIST: Beverley Fiedler, MD REFERRED BY:  Glenna Fellows PROCEDURE DATE:  01/17/2013 PROCEDURE:  EGD, diagnostic ASA CLASS:     Class II INDICATIONS:  Nausea.   Vomiting. MEDICATIONS: These medications were titrated to patient response per physician's verbal order, Diphenhydramine (Benadryl) 25 mg IV, Fentanyl 100 mcg IV, and Versed 10 mg IV TOPICAL ANESTHETIC: Cetacaine Spray  DESCRIPTION OF PROCEDURE: After the risks benefits and alternatives of the procedure were thoroughly explained, informed consent was obtained.  The Pentax standard adult upper  endoscope was introduced through the mouth and advanced to the proximal jejunum. Without limitations.  The instrument was slowly withdrawn as the mucosa was fully examined.    ESOPHAGUS: The mucosa of the esophagus appeared normal.   A normal Z-line was observed 38 cm from the incisors.  STOMACH: A Roux -en-Y anastomosis was found in the gastric cardia characterized as healthy in appearance.  The gastrojejunostomy was open without evidence of surrounding inflammation or stricture. The mucosa of the gastric pouch appeared normal.  The prior gastrojejunostomy site was visible in the distal gastric pouch but well closed.  JEJUNUM: The efferent jejunal limb (and short blind limb) was examined and showed no abnormalities.  Retroflexion was not performed.     The scope was then withdrawn from the patient and the procedure completed.  COMPLICATIONS: There were no complications.  ENDOSCOPIC IMPRESSION: 1.   The mucosa of the esophagus appeared normal; Z line normal at 38 cm 2.   Roux-en-Y anastomosis, healthy in appearance with open gastrojejunostomy. 3.   The mucosa of the gastric pouch appeared normal 3.   Normal examined  efferent jejunal limb  RECOMMENDATIONS: Further plans per Dr.  [name]  eSigned:  Beverley Fiedler, MD 01/17/2013 9:19 AM         CC:  The Patient

## 2013-01-18 LAB — URINE CULTURE: Culture: NO GROWTH

## 2013-01-18 MED ORDER — PROMETHAZINE HCL 25 MG/ML IJ SOLN
12.5000 mg | INTRAMUSCULAR | Status: DC | PRN
Start: 2013-01-18 — End: 2013-01-21
  Administered 2013-01-18 – 2013-01-20 (×8): 12.5 mg via INTRAVENOUS
  Filled 2013-01-18 (×8): qty 1

## 2013-01-18 NOTE — Progress Notes (Signed)
Patient ID: [name], female   DOB: Jan 02, 1981, 32 y.o.   MRN: [id number] 1 Day Post-Op  Subjective: Tolerated clear liquids without difficulty yesterday. She had a little epigastric pain yesterday but none this morning.  Objective: Vital signs in last 24 hours: Temp:  [98.2 F (36.8 C)-98.6 F (37 C)] 98.5 F (36.9 C) (03/23 0446) Pulse Rate:  [88-98] 92 (03/23 0446) Resp:  [16-18] 18 (03/23 0446) BP: (117-134)/(76-93) 131/76 mmHg (03/23 0446) SpO2:  [94 %-100 %] 94 % (03/23 0446) Last BM Date: 01/15/13  Intake/Output from previous day: 03/22 0701 - 03/23 0700 In: 540 [P.O.:240; I.V.:300] Out: -  Intake/Output this shift:    General appearance: alert, cooperative and no distress GI: normal findings: soft, non-tender  Lab Results:   Recent Labs  01/16/13 1635  WBC 8.2  HGB 9.5*  HCT 31.5*  PLT 549*   BMET  Recent Labs  01/16/13 1635  NA 136  K 5.1  CL 103  CO2 23  GLUCOSE 86  BUN 11  CREATININE 0.89  CALCIUM 8.8     Studies/Results: Dg Abd Acute W/chest  01/16/2013  *RADIOLOGY REPORT*  Clinical Data: Previous gastric bypass  ACUTE ABDOMEN SERIES (ABDOMEN 2 VIEW & CHEST 1 VIEW)  Comparison: Upper GI 09/07/2011  Findings: Normal cardiac silhouette.  No effusion, infiltrate, pneumothorax.  No free air beneath hemidiaphragms.  There is a moderate volume stool in the ascending colon.  There is stool in the sigmoid colon as well as gas.  No pathologically dilated loops of large or small bowel.  The cecum does measure up to 7.5 cm. Small amount gas in the rectum.  IMPRESSION:  1.  Moderate volume stool within the colon suggests  constipation. 2.  Lung bases are clear.   Original Report Authenticated By: Genevive Bi, M.D.     Anti-infectives: Anti-infectives   None      Assessment/Plan: s/p Procedure(s): ESOPHAGOGASTRODUODENOSCOPY (EGD) Admitted with persistent nausea and vomiting with history of gastric bypass and gastrojejunostomy revision  one-year ago. EGD was entirely negative. She is tolerating liquids okay. Possible viral syndrome. Will advance to solid diet today. If she tolerates this possibly home tomorrow.   LOS: 2 days    Carollee Nussbaumer T 01/18/2013

## 2013-01-19 ENCOUNTER — Inpatient Hospital Stay (HOSPITAL_COMMUNITY): Payer: Managed Care, Other (non HMO)

## 2013-01-19 ENCOUNTER — Encounter (HOSPITAL_COMMUNITY): Payer: Self-pay | Admitting: Internal Medicine

## 2013-01-19 LAB — GLUCOSE, CAPILLARY
Glucose-Capillary: 109 mg/dL — ABNORMAL HIGH (ref 70–99)
Glucose-Capillary: 75 mg/dL (ref 70–99)

## 2013-01-19 NOTE — Progress Notes (Signed)
Patient ID: [name], female   DOB: Mar 12, 1981, 32 y.o.   MRN: [id number] 2 Days Post-Op  Subjective: Tolerated clear liquids but vomited solid food yesterday. No pain. No BM in 2 days.  Otherwise feels well with no C/O  Objective: Vital signs in last 24 hours: Temp:  [98.2 F (36.8 C)-98.7 F (37.1 C)] 98.3 F (36.8 C) (03/24 0538) Pulse Rate:  [102-131] 102 (03/24 0538) Resp:  [16-20] 20 (03/24 0538) BP: (125-136)/(76-87) 125/79 mmHg (03/24 0538) SpO2:  [94 %-100 %] 94 % (03/24 0538) Last BM Date: 01/16/13  Intake/Output from previous day: 03/23 0701 - 03/24 0700 In: 240 [P.O.:240] Out: -  Intake/Output this shift:    General appearance: alert, cooperative and no distress GI: normal findings: bowel sounds normal and soft, non-tender  Lab Results:   Recent Labs  01/16/13 1635  WBC 8.2  HGB 9.5*  HCT 31.5*  PLT 549*   BMET  Recent Labs  01/16/13 1635  NA 136  K 5.1  CL 103  CO2 23  GLUCOSE 86  BUN 11  CREATININE 0.89  CALCIUM 8.8     Studies/Results: No results found.  Anti-infectives: Anti-infectives   None      Assessment/Plan: s/p Procedure(s): ESOPHAGOGASTRODUODENOSCOPY (EGD) N&V, etiology uncertain Will get CT today to evaluate rest of GI tract   LOS: 3 days    Cathlyn Tersigni T 01/19/2013

## 2013-01-20 LAB — GLUCOSE, CAPILLARY
Glucose-Capillary: 91 mg/dL (ref 70–99)
Glucose-Capillary: 90 mg/dL (ref 70–99)

## 2013-01-20 MED ORDER — METOCLOPRAMIDE HCL 5 MG PO TABS
5.0000 mg | ORAL_TABLET | Freq: Three times a day (TID) | ORAL | Status: DC
  Administered 2013-01-20 – 2013-01-21 (×2): 5 mg via ORAL
  Filled 2013-01-20 (×5): qty 1

## 2013-01-20 NOTE — ED Provider Notes (Signed)
Medical screening examination/treatment/procedure(s) were conducted as a shared visit with non-physician practitioner(s) and myself.  I personally evaluated the patient during the encounter.  No acute abd.  Will consult gen surg  Donnetta Hutching, MD 01/20/13 1034

## 2013-01-20 NOTE — Progress Notes (Signed)
Patient ID: [name], female   DOB: August 02, 1981, 32 y.o.   MRN: [id number] 3 Days Post-Op  Subjective: No C/O back on CL, no nausea or pain.  Did not tolerate solid food  Objective: Vital signs in last 24 hours: Temp:  [98.4 F (36.9 C)-98.7 F (37.1 C)] 98.4 F (36.9 C) (03/25 0515) Pulse Rate:  [101-118] 118 (03/25 0515) Resp:  [16-20] 16 (03/25 0515) BP: (105-131)/(67-88) 105/67 mmHg (03/25 0515) SpO2:  [96 %-100 %] 96 % (03/25 0515) Last BM Date: 01/19/13  Intake/Output from previous day: 03/24 0701 - 03/25 0700 In: 980 [P.O.:480; I.V.:500] Out: -  Intake/Output this shift:    General appearance: alert, cooperative and no distress GI: normal findings: soft, non-tender  Lab Results:  No results found for this basename: WBC, HGB, HCT, PLT,  in the last 72 hours BMET No results found for this basename: NA, K, CL, CO2, GLUCOSE, BUN, CREATININE, CALCIUM,  in the last 72 hours   Studies/Results: Ct Abdomen Pelvis Wo Contrast  01/19/2013  *RADIOLOGY REPORT*  Clinical Data: Nausea and vomiting.  Prior gastric bypass surgery. Report of prior contrast reaction, reportedly anaphylactoid reaction to iodine.  CT ABDOMEN AND PELVIS WITHOUT CONTRAST  Technique:  Multidetector CT imaging of the abdomen and pelvis was performed following the standard protocol without intravenous contrast.  Comparison: 08/15/2011  Findings: Curvilinear left greater than right lower lobe atelectasis or scarring noted.  Trace pericardial fluid is present. Evidence of gastric bypass surgery.  No dilatation of the gastric remnant/pouch. Cholecystectomy clips noted.  3 mm nonobstructing right lower renal pole calculus is larger.  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  Adrenal glands, liver, left kidney, spleen, and pancreas are normal allowing for unenhanced technique. Tiny fat containing midline ventral hernia is identified 1.3 cm superior to the umbilicus  without evidence of complication.  No ascites or lymphadenopathy.  No free air.  No bowel wall thickening or focal segmental dilatation.  The appendix is not visualized, compatible with previous surgery. Uterus and ovaries are normal.  No acute osseous abnormality.  IMPRESSION: No acute intra-abdominal or pelvic pathology.  Small fat containing supraumbilical ventral abdominal wall hernia without evidence for complication, new since the prior exam.   Original Report Authenticated By: Christiana Pellant, M.D.     Anti-infectives: Anti-infectives   None      Assessment/Plan: s/p Procedure(s): ESOPHAGOGASTRODUODENOSCOPY (EGD) Persistent N/V post bypass.  W/U (EGD, lab, CT) all negative.  ? Motility problem Will ask GI medicine to see today Advance to [facility] diet.  Anticipate discharge tomorrow if she is tolerating this   LOS: 4 days    Sincere Berlanga T 01/20/2013

## 2013-01-20 NOTE — Progress Notes (Signed)
[facility] Progress Note  SUBJECTIVE: no specific complaints.  OBJECTIVE:  Vital signs in last 24 hours: Temp:  [98.4 F (36.9 C)-98.7 F (37.1 C)] 98.4 F (36.9 C) (03/25 0515) Pulse Rate:  [101-118] 118 (03/25 0515) Resp:  [16-20] 16 (03/25 0515) BP: (105-131)/(67-88) 105/67 mmHg (03/25 0515) SpO2:  [96 %-100 %] 96 % (03/25 0515) Last BM Date: 01/19/13 General:    Pleasant white female in NAD Abdomen:  Soft, nontender and nondistended. Normal bowel sounds. Extremities:  Without edema. Neurologic:  Alert and oriented,  grossly normal neurologically. Psych:  Cooperative. Normal mood and affect.    Studies/Results: Ct Abdomen Pelvis Wo Contrast  01/19/2013  *RADIOLOGY REPORT*  Clinical Data: Nausea and vomiting.  Prior gastric bypass surgery. Report of prior contrast reaction, reportedly anaphylactoid reaction to iodine.  CT ABDOMEN AND PELVIS WITHOUT CONTRAST  Technique:  Multidetector CT imaging of the abdomen and pelvis was performed following the standard protocol without intravenous contrast.  Comparison: 08/15/2011  Findings: Curvilinear left greater than right lower lobe atelectasis or scarring noted.  Trace pericardial fluid is present. Evidence of gastric bypass surgery.  No dilatation of the gastric remnant/pouch. Cholecystectomy clips noted.  3 mm nonobstructing right lower renal pole calculus is larger.  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  Adrenal glands, liver, left kidney, spleen, and pancreas are normal allowing for unenhanced technique. Tiny fat containing midline ventral hernia is identified 1.3 cm superior to the umbilicus without evidence of complication.  No ascites or lymphadenopathy.  No free air.  No bowel wall thickening or focal segmental dilatation.  The appendix is not visualized, compatible with previous surgery. Uterus and ovaries are normal.  No acute osseous abnormality.  IMPRESSION: No  acute intra-abdominal or pelvic pathology.  Small fat containing supraumbilical ventral abdominal wall hernia without evidence for complication, new since the prior exam.   Original Report Authenticated By: Christiana Pellant, M.D.    EGD 01/17/13 ([place]) ENDOSCOPIC IMPRESSION:  1. The mucosa of the esophagus appeared normal; Z line normal at  38 cm  2. Roux-en-Y anastomosis, healthy in appearance with open  gastrojejunostomy.  3. The mucosa of the gastric pouch appeared normal  3. Normal examined efferent jejunal limb   ASSESSMENT / PLAN:  1. Nausea and vomiting. We saw her in consult this admission 01/17/13 and performed EGD which was normal (see above). Patient unable to advance beyond clears without nausea. Non-contrast CTscan yesterday was unrevealing. Spoke at length with patient (and husband in room). She describes chronic nausea several times a week with vomiting maybe 4 or so times a month. She has managed chronic symptoms with alteration in diet / quantity of food consumed. Events leading up to this admission included low grade temp and refractory nausea and vomiting. She had a headache as well but this isn't uncommon for her. Perhaps with low grade temp she had viral process and is left with post-infectious gastroparesis. I reviewed home meds. She started Seroquel and Tofranil 4 months ago so doubtful either one just now causing such nausea. I will try low dose Reglan. I explained mechanism of medication to patient. Continue Phenergan, it helps more than Zofran.   2.  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   3. Hypothyroidism, I don't see TSH. Will order.    Addendum: Husband stopped me in hallway. He inquires about possibility of Addison's disease as cause for wife's  symptoms. I explained that Addison's doesn't seem likely and no reason at this point to suggest secondary adrenal insufficiency either.    LOS: 4 days    Willette Cluster  01/20/2013, 12:10 PM   GI ATTENDING  Asked to reevaluate  patient regarding ongoing problems with vomiting after solids. Previous GI evaluation and endoscopy reviewed. No evidence for anatomic or structural issues. She states that she has had these symptoms to some degree for several years, though worse over the past 6 weeks. She has no difficulties with liquids. Despite her difficulties, she has lost no weight. No evidence for motility disorder to explain symptoms. I wonder if the solid food bolus is too large for her pouch? In any event, nothing additional to have from a medical standpoint. If she does well with liquids, then I agree discharging on liquids. She can advance to full liquids and softer foods thereafter as tolerated. Again, small bolus size will be important. Discussed with patient and husband. Discussed with Dr. [name]. Will sign off. Thanks  Wilhemina Bonito. Eda Keys., M.D. [facility] Division of Gastroenterology

## 2013-01-21 MED ORDER — METOCLOPRAMIDE HCL 5 MG PO TABS: 5.0000 mg | ORAL_TABLET | Freq: Three times a day (TID) | ORAL | Status: DC

## 2013-01-21 MED ORDER — PROMETHAZINE HCL 12.5 MG PO TABS: 12.5000 mg | ORAL_TABLET | Freq: Four times a day (QID) | ORAL | Status: DC | PRN

## 2013-01-21 NOTE — Care Management (Signed)
Cm spoke with patient concerning discharge planning. Pt's spouse present at bedside. PTA pt independent. No HH or DME needs stated. Pt discharged home with spouse assisting in home care.    Roxy Manns Gwynne Kemnitz,RN,BSN [phone]

## 2013-01-21 NOTE — Progress Notes (Signed)
Patient ID: [name], female   DOB: 1981/01/07, 32 y.o.   MRN: [id number] 4 Days Post-Op  Subjective: The patient was able to tolerate liquids yesterday without vomiting. No double pain or other complaints.  Objective: Vital signs in last 24 hours: Temp:  [98.2 F (36.8 C)-98.7 F (37.1 C)] 98.2 F (36.8 C) (03/26 4098) Pulse Rate:  [103-115] 115 (03/26 0613) Resp:  [18] 18 (03/26 0613) BP: (113-144)/(70-97) 113/70 mmHg (03/26 0613) SpO2:  [96 %-100 %] 96 % (03/26 0613) Last BM Date: 01/19/13  Intake/Output from previous day: 03/25 0701 - 03/26 0700 In: 590 [P.O.:240; I.V.:350] Out: -  Intake/Output this shift:    General appearance: alert, cooperative and no distress GI: normal findings: soft, non-tender  Lab Results:  No results found for this basename: WBC, HGB, HCT, PLT,  in the last 72 hours BMET No results found for this basename: NA, K, CL, CO2, GLUCOSE, BUN, CREATININE, CALCIUM,  in the last 72 hours   Studies/Results: Ct Abdomen Pelvis Wo Contrast  01/19/2013  *RADIOLOGY REPORT*  Clinical Data: Nausea and vomiting.  Prior gastric bypass surgery. Report of prior contrast reaction, reportedly anaphylactoid reaction to iodine.  CT ABDOMEN AND PELVIS WITHOUT CONTRAST  Technique:  Multidetector CT imaging of the abdomen and pelvis was performed following the standard protocol without intravenous contrast.  Comparison: 08/15/2011  Findings: Curvilinear left greater than right lower lobe atelectasis or scarring noted.  Trace pericardial fluid is present. Evidence of gastric bypass surgery.  No dilatation of the gastric remnant/pouch. Cholecystectomy clips noted.  3 mm nonobstructing right lower renal pole calculus is larger.  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  Adrenal glands, liver, left kidney, spleen, and pancreas are normal allowing for unenhanced technique. Tiny fat containing midline ventral hernia is  identified 1.3 cm superior to the umbilicus without evidence of complication.  No ascites or lymphadenopathy.  No free air.  No bowel wall thickening or focal segmental dilatation.  The appendix is not visualized, compatible with previous surgery. Uterus and ovaries are normal.  No acute osseous abnormality.  IMPRESSION: No acute intra-abdominal or pelvic pathology.  Small fat containing supraumbilical ventral abdominal wall hernia without evidence for complication, new since the prior exam.   Original Report Authenticated By: Christiana Pellant, M.D.     Anti-infectives: Anti-infectives   None      Assessment/Plan: s/p Procedure(s): ESOPHAGOGASTRODUODENOSCOPY (EGD) Nausea and vomiting of uncertain etiology. Possible slowly resolving viral syndrome with some degree of motility disorder and possibly related to the habits. Appreciate GI evaluation. Will plan discharge today on a liquid diet and the patient will try to gradually advance to solid foods. She has prescriptions for Reglan and Phenergan. We discussed that she must eat very slowly, small meals, chewing well. Will followup in the office in 4 weeks.   LOS: 5 days    Mavric Cortright T 01/21/2013

## 2013-01-21 NOTE — Progress Notes (Signed)
During d/c instructions pt mentioned she no longer takes protonix at home, Dr [name] notified. Telephone order received for pt not to take the protonix that is included in her d/c med list. Pt made aware of this change, no other questions or concerns at this time.

## 2013-01-21 NOTE — Progress Notes (Signed)
Patient discharged home, all discharge medications and instructions reviewed and questions answered. Pt to be assisted to vehicle by wheelchair.

## 2013-01-21 NOTE — Discharge Summary (Signed)
   Patient ID: [name] [id number] 31 y.o. 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 She presented to the hospital with 5 days of persistent frequent nausea and vomiting of liquids and any solids. She also has a history of intermittent nausea and vomiting over a number of years. Due to inability to take by mouth she was admitted for further treatment and evaluation.  Hospital Course: patient was admitted and started on IV fluids and antibiotics. 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 This was entirely negative with a widely patent anastomosis and small gastric pouch with normal mucosa. She was able to tolerate liquids following this but not solid food. Laboratory evaluation was unremarkable. CT scan of the abdomen and pelvis was obtained which was entirely normal without any cause seen for nausea and vomiting. Further consultation was obtained from GI medicine and she was started on low dose Reglan but no specific diagnosis could be discerned. She is however tolerating liquids without difficulty at the time of discharge. It is felt possibly she had a severe viral syndrome and is gradually recovering from this. She will be discharged on a liquid diet with plans to gradually advance to solid food as tolerated.  Consults: GI  Significant Diagnostic Studies: endoscopy: gastroscopy: Negative  Treatments: IV hydration  Disposition:  Home  Patient Instructions:    Medication List    TAKE these medications       imipramine 150 MG capsule  Commonly known as:  TOFRANIL-PM  Take 150 mg by mouth at bedtime.     levothyroxine 100 MCG tablet  Commonly known as:  SYNTHROID, LEVOTHROID  Take 100 mcg by mouth daily.     metFORMIN 500 MG tablet  Commonly known as:  GLUCOPHAGE  Take 500 mg by mouth daily.     metoCLOPramide 5 MG tablet  Commonly known as:  REGLAN  Take 1 tablet (5 mg total) by mouth 3 (three) times daily before meals.     multivitamins ther. w/minerals Tabs  Take 1 tablet by mouth daily.     promethazine 12.5 MG tablet  Commonly known as:  PHENERGAN  Take 1 tablet (12.5 mg total) by mouth every 6 (six) hours as needed for nausea.     QUEtiapine 400 MG tablet  Commonly known as:  SEROQUEL  Take 400 mg by mouth at bedtime.     Vitamin D3 5000 UNITS Caps  Take 1 tablet by mouth daily.      ASK your doctor about these medications       pantoprazole 40 MG tablet  Commonly known as:  PROTONIX  Take 1 tablet (40 mg total) by mouth daily.        Activity: activity as tolerated Diet: clear liquids, advance as tolerated Wound Care: none needed  Follow-up:  With Dr. [name] in 1 month.  Signed: Mariella Saa MD, FACS  01/21/2013, 7:44 AM

## 2013-02-26 ENCOUNTER — Ambulatory Visit (INDEPENDENT_AMBULATORY_CARE_PROVIDER_SITE_OTHER): Payer: Managed Care, Other (non HMO) | Admitting: General Surgery

## 2013-03-12 ENCOUNTER — Encounter (INDEPENDENT_AMBULATORY_CARE_PROVIDER_SITE_OTHER): Payer: Self-pay | Admitting: General Surgery

## 2013-06-10 ENCOUNTER — Ambulatory Visit (INDEPENDENT_AMBULATORY_CARE_PROVIDER_SITE_OTHER): Payer: Managed Care, Other (non HMO) | Admitting: Family Medicine

## 2013-06-10 VITALS — BP 110/80 | HR 93 | Temp 98.0°F | Resp 16 | Ht 63.5 in | Wt 279.0 lb

## 2013-06-10 DIAGNOSIS — M791 Myalgia, unspecified site: Secondary | ICD-10-CM

## 2013-06-10 DIAGNOSIS — R5381 Other malaise: Secondary | ICD-10-CM

## 2013-06-10 DIAGNOSIS — Z8269 Family history of other diseases of the musculoskeletal system and connective tissue: Secondary | ICD-10-CM

## 2013-06-10 DIAGNOSIS — Z840 Family history of diseases of the skin and subcutaneous tissue: Secondary | ICD-10-CM

## 2013-06-10 DIAGNOSIS — D509 Iron deficiency anemia, unspecified: Secondary | ICD-10-CM

## 2013-06-10 DIAGNOSIS — E039 Hypothyroidism, unspecified: Secondary | ICD-10-CM

## 2013-06-10 DIAGNOSIS — IMO0001 Reserved for inherently not codable concepts without codable children: Secondary | ICD-10-CM

## 2013-06-10 LAB — POCT CBC
Hemoglobin: 9 g/dL — AB (ref 12.2–16.2)
Lymph, poc: 2.5 (ref 0.6–3.4)
MCH, POC: 20.9 pg — AB (ref 27–31.2)
MCHC: 29 g/dL — AB (ref 31.8–35.4)
MCV: 72 fL — AB (ref 80–97)
RBC: 4.3 M/uL (ref 4.04–5.48)
WBC: 8.1 10*3/uL (ref 4.6–10.2)

## 2013-06-10 LAB — POCT URINALYSIS DIPSTICK
Bilirubin, UA: NEGATIVE
Blood, UA: NEGATIVE
Glucose, UA: NEGATIVE
Ketones, UA: NEGATIVE
Nitrite, UA: NEGATIVE

## 2013-06-10 LAB — POCT UA - MICROSCOPIC ONLY
Bacteria, U Microscopic: NEGATIVE
Mucus, UA: NEGATIVE
Yeast, UA: NEGATIVE

## 2013-06-10 MED ORDER — DICLOFENAC SODIUM 75 MG PO TBEC: 75.0000 mg | DELAYED_RELEASE_TABLET | Freq: Two times a day (BID) | ORAL | Status: DC

## 2013-06-10 NOTE — Patient Instructions (Addendum)
I will be in touch regarding your labs as soon as they come in.  Please talk to your mental health professional about your seroquel- ?another medication could be used for your insomnia.    You do have anemia- I suspect you are low on iron. This could certainly contribute to your fatigue.  As soon as I get your ferritin level back I will let you know if we need to start an rx iron supplement.

## 2013-06-10 NOTE — Progress Notes (Addendum)
[facility] [address] [place] [address] [phone]  Date:  06/10/2013   Name:  [name]   DOB:  06-14-1981   MRN:  [id number]  PCP:  [name], MD    Chief Complaint: Generalized Body Aches   History of Present Illness:  [name] is a 32 y.o. very pleasant female patient who presents with the following:  She is here today as a new patient.  Here today with her mother, who has lupus. Mikel has noted multiple muscle and joint pains for several years.   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   She gets very tired out just walking a few feet in her home.  In the morning she will feel very stiff and sore.   She was tested for lupus "a long time ago."  However, her evaluations have been negative.  She has been treated with lyrica, meloxicam and flexeril but these did not seem to help. She did have gastric bypass in 2005, and had a reversal in 2012.  She weighed about 120- 130 lbs prior to bypass reversal but states that even when she was thin she had a lot of pains.    She is on medication for her thyroid.    She was last tested for lupus about 4 years ago.  She has seen a couple of rheumatologists and both have not found a specific rheumatological cause for her sx.    She takes seroquel for insomnia.  She has been taking this for about 8 months and has gained about 65 lbs in that tie period.  She is seeing a NP with a psychiatrist in [place].    She takes metformin for PCOS.   She does get menses- her last was about one week ago.  States that her menses are not usually overly heavy   She also has a history of iron deficiency anemia and was treated with iron infusion in the past- her last infusion about 18 months ago she thinks   She is taking 800 mg of ibuprofen BID right now.  This can sometimes irritate her stomach.   She is married, is a non- smoker.    Patient Active Problem List   Diagnosis Date Noted  . Nausea and vomiting in adult 01/17/2013  . 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 Complications of gastric bypass surgery 01/11/2012  . Migraine 09/05/2011  . RLS (restless legs syndrome) 09/05/2011  . Hypothyroidism 09/05/2011  . Depression 09/02/2011  . Anxiety 09/02/2011    Past Medical History  Diagnosis Date  . Hypothyroidism   . Blood transfusion   . GERD (gastroesophageal reflux disease)   . Headache(784.0)   . Anemia   . Depression   . Anxiety   . Polycystic ovarian syndrome     diagnosed at very early age  . Abdominal pain     Past Surgical History  Procedure Laterality Date  . Tonsillectomy  1983  . Adenoidectomy    . Gastrojejunostomy  09/06/2011    Procedure: LAPAROSCOPIC GASTROJEJUNOSTOMY;  Surgeon: Mariella Saa, MD;  Location: [facility];  Service: General;  Laterality: N/A;  . Esophagogastroduodenoscopy  09/03/2011    Procedure: ESOPHAGOGASTRODUODENOSCOPY (EGD);  Surgeon: Kandis Cocking, MD;  Location: [facility];  Service: General;  Laterality: N/A;  . Appendectomy  1996  . Cholecystectomy  1998  . Esophagogastroduodenoscopy N/A 01/17/2013    Procedure:  ESOPHAGOGASTRODUODENOSCOPY (EGD);  Surgeon: Beverley Fiedler, MD;  Location: [facility];  Service: Gastroenterology;  Laterality: N/A;  Pat    History  Substance Use Topics  . Smoking status: Never Smoker   . Smokeless tobacco: Never Used  . Alcohol Use: No    Family History  Problem Relation Age of Onset  . Cancer Mother     breast  . Other Father     cerebal vasculitis  . Cancer Maternal Grandmother     skin  . Cancer Maternal Grandfather     lung    Allergies  Allergen Reactions  . Iodine Anaphylaxis    Pt stated on 01-19-13-----difficulty breathing with pre-meds on board in past  . Shellfish-Derived Products Anaphylaxis  . Adhesive [Tape] Other (See Comments)    Unknown reaction  . Contrast Media [Iodinated Diagnostic Agents] Nausea And Vomiting and  Other (See Comments)    headache  . Latex Other (See Comments)    Plastic tape (unknown reaction)  . Topiramate [Topamax] Other (See Comments)    tremors  . Lamictal [Lamotrigine] Rash  . Penicillins Rash    Medication list has been reviewed and updated.  Current Outpatient Prescriptions on File Prior to Visit  Medication Sig Dispense Refill  . imipramine (TOFRANIL-PM) 150 MG capsule Take 150 mg by mouth at bedtime.      Marland Kitchen levothyroxine (SYNTHROID, LEVOTHROID) 100 MCG tablet Take 100 mcg by mouth daily.      . metFORMIN (GLUCOPHAGE) 500 MG tablet Take 500 mg by mouth daily.        . metoCLOPramide (REGLAN) 5 MG tablet Take 1 tablet (5 mg total) by mouth 3 (three) times daily before meals.  90 tablet  2  . Multiple Vitamins-Minerals (MULTIVITAMINS THER. W/MINERALS) TABS Take 1 tablet by mouth daily.        . QUEtiapine (SEROQUEL) 400 MG tablet Take 400 mg by mouth at bedtime.      . Cholecalciferol (VITAMIN D3) 5000 UNITS CAPS Take 1 tablet by mouth daily.      . pantoprazole (PROTONIX) 40 MG tablet Take 1 tablet (40 mg total) by mouth daily.  30 tablet  1  . promethazine (PHENERGAN) 12.5 MG tablet Take 1 tablet (12.5 mg total) by mouth every 6 (six) hours as needed for nausea.  30 tablet  1   No current facility-administered medications on file prior to visit.    Review of Systems:  As per HPI- otherwise negative.   Physical Examination: Filed Vitals:   06/10/13 1556  BP: 110/80  Pulse: 93  Temp: 98 F (36.7 C)  Resp: 16   Filed Vitals:   06/10/13 1556  Height: 5' 3.5" (1.613 m)  Weight: 279 lb (126.554 kg)   Body mass index is 48.64 kg/(m^2). Ideal Body Weight: Weight in (lb) to have BMI = 25: 143.1  GEN: WDWN, NAD, Non-toxic, A & O x 3, obese HEENT: Atraumatic, Normocephalic. Neck supple. No masses, No LAD.  Bilateral TM wnl, oropharynx normal.  PEERL,EOMI.   Ears and Nose: No external deformity. CV: RRR, No M/G/R. No JVD. No thrill. No extra heart sounds. PULM:  CTA B, no wheezes, crackles, rhonchi. No retractions. No resp. distress. No accessory muscle use. ABD: S, NT, ND, +BS. No rebound. No HSM. EXTR: No c/c/e.  Normal joints, no nodules.  Normal ROM of major joints   NEURO Normal gait.  PSYCH: Normally interactive. Conversant. Not depressed or anxious appearing.  Calm demeanor.    Results for orders  placed in visit on 06/10/13  POCT CBC      Result Value Range   WBC 8.1  4.6 - 10.2 K/uL   Lymph, poc 2.5  0.6 - 3.4   POC LYMPH PERCENT 31.3  10 - 50 %L   MID (cbc) 0.5  0 - 0.9   POC MID % 5.6  0 - 12 %M   POC Granulocyte 5.1  2 - 6.9   Granulocyte percent 63.1  37 - 80 %G   RBC 4.30  4.04 - 5.48 M/uL   Hemoglobin 9.0 (*) 12.2 - 16.2 g/dL   HCT, POC 16.1 (*) 09.6 - 47.9 %   MCV 72.0 (*) 80 - 97 fL   MCH, POC 20.9 (*) 27 - 31.2 pg   MCHC 29.0 (*) 31.8 - 35.4 g/dL   RDW, POC 04.5     Platelet Count, POC 575 (*) 142 - 424 K/uL   MPV 7.4  0 - 99.8 fL  POCT UA - MICROSCOPIC ONLY      Result Value Range   WBC, Ur, HPF, POC 0-1     RBC, urine, microscopic 1-3     Bacteria, U Microscopic neg     Mucus, UA neg     Epithelial cells, urine per micros neg     Crystals, Ur, HPF, POC neg     Casts, Ur, LPF, POC neg     Yeast, UA neg    POCT URINALYSIS DIPSTICK      Result Value Range   Color, UA yellow     Clarity, UA clear     Glucose, UA neg     Bilirubin, UA neg     Ketones, UA neg     Spec Grav, UA 1.010     Blood, UA neg     pH, UA 6.0     Protein, UA neg     Urobilinogen, UA 0.2     Nitrite, UA neg     Leukocytes, UA Negative    POCT URINE PREGNANCY      Result Value Range   Preg Test, Ur Negative    POCT SEDIMENTATION RATE      Result Value Range   POCT SED RATE 52 (*) 0 - 22 mm/hr    Assessment and Plan: Myalgia - Plan: ANA, Anti-DNA antibody, double-stranded, Anti-Smith antibody, diclofenac (VOLTAREN) 75 MG EC tablet  Other malaise and fatigue - Plan: POCT CBC, Comprehensive metabolic panel, POCT UA - Microscopic Only,  POCT urinalysis dipstick, POCT urine pregnancy, ANA, Anti-DNA antibody, double-stranded, Anti-Smith antibody, POCT SEDIMENTATION RATE  Family history of systemic lupus erythematosus  Unspecified hypothyroidism - Plan: TSH  Microcytic anemia - Plan: Ferritin  Jacquel is here as a new pt today with long standing pains over most of her body.  Her mother is concerned that she may have lupus, and we weill certainly check her for this.  However, suspect her pains are multifactorial stemming from [facility], obesity, and perhaps depression.  She does have microcytic anemia- await ferritin but likely will need to star iron supplement.  For now will try voltaren for her pains- EC tablets may be easier on her stomach.   Signed Abbe Amsterdam, MD  Called to go over labs 8/15; no answer so left detailed message.  She needs iron replacement.  If she has some at home go ahead and start taking it.  Will call her back

## 2013-06-11 LAB — ANTI-DNA ANTIBODY, DOUBLE-STRANDED: ds DNA Ab: 2 IU/mL (ref <30)

## 2013-06-11 LAB — FERRITIN: Ferritin: 3 ng/mL — ABNORMAL LOW (ref 10–291)

## 2013-06-11 LAB — TSH: TSH: 0.575 u[IU]/mL (ref 0.350–4.500)

## 2013-06-11 LAB — COMPREHENSIVE METABOLIC PANEL
ALT: 11 U/L (ref 0–35)
BUN: 16 mg/dL (ref 6–23)
CO2: 26 mEq/L (ref 19–32)
Calcium: 8.9 mg/dL (ref 8.4–10.5)
Chloride: 102 mEq/L (ref 96–112)
Creat: 1.28 mg/dL — ABNORMAL HIGH (ref 0.50–1.10)
Glucose, Bld: 88 mg/dL (ref 70–99)

## 2013-06-11 LAB — ANA: Anti Nuclear Antibody(ANA): NEGATIVE

## 2013-06-13 ENCOUNTER — Telehealth: Payer: Self-pay

## 2013-06-13 NOTE — Telephone Encounter (Signed)
Dr. Patsy Lager, pt returning your call

## 2013-06-14 NOTE — Telephone Encounter (Signed)
Called her back- reached her. She would like to see heme- once as iron infusions have helped her in the past better than oral iron.  I will take care of this referral for her.  Otherwise TSH is normal, lupus unlikely, creat slightly high (need to watch).

## 2013-06-15 ENCOUNTER — Telehealth: Payer: Self-pay

## 2013-06-15 NOTE — Telephone Encounter (Signed)
Called her back- LMOM.  If she has a hematologist in [place] she should certainly call them and make an appt.  I cannot place any order at [facility].  If she will set up her mychart she will be able to access and print her own labs to share with her doctor there.

## 2013-06-15 NOTE — Telephone Encounter (Signed)
I had called [facility] to see when they could see pt at Dr Copland's request before seeing this message. They would be able to see pt next week around the 25 th if we need to call them back for appt.  Had called and LMOM for pt to CB. Ask pt if she needs referral to [facility] and hematologist's name.

## 2013-06-15 NOTE — Telephone Encounter (Signed)
Pt is wanting to let dr copland know that she is now back in [place] and would like to go to her hematologist there to have the iron infustion [phone] at [facility]

## 2013-06-17 NOTE — Telephone Encounter (Signed)
LMOM for rtn call.

## 2013-06-18 ENCOUNTER — Encounter: Payer: Self-pay | Admitting: Family Medicine

## 2016-01-16 ENCOUNTER — Encounter (HOSPITAL_COMMUNITY): Payer: Self-pay | Admitting: Emergency Medicine

## 2016-01-16 ENCOUNTER — Emergency Department (HOSPITAL_COMMUNITY)
Admission: EM | Admit: 2016-01-16 | Discharge: 2016-01-16 | Disposition: A | Discharge status: Home / Self Care | Payer: Managed Care, Other (non HMO) | Source: Home / Self Care | Attending: Family Medicine | Admitting: Family Medicine

## 2016-01-16 DIAGNOSIS — R059 Cough, unspecified: Secondary | ICD-10-CM

## 2016-01-16 DIAGNOSIS — R05 Cough: Secondary | ICD-10-CM | POA: Diagnosis not present

## 2016-01-16 MED ORDER — HYDROCOD POLST-CPM POLST ER 10-8 MG/5ML PO SUER: 5.0000 mL | Freq: Two times a day (BID) | ORAL | Status: DC

## 2016-01-16 NOTE — Discharge Instructions (Signed)

## 2016-01-16 NOTE — ED Notes (Signed)
See provider note. 

## 2016-01-17 NOTE — ED Provider Notes (Signed)
CSN: [id number]     Arrival date & time 01/16/16  1510 History   First MD Initiated Contact with Patient 01/16/16 1655     Chief Complaint  Patient presents with  . URI   (Consider location/radiation/quality/duration/timing/severity/associated sxs/prior Treatment) HPI Lingering cough not getting better. Unable to sleep. Recent treatment for cough with other rx  No pain or fever No sputum production.  Past Medical History  Diagnosis Date  . Hypothyroidism   . Blood transfusion   . GERD (gastroesophageal reflux disease)   . Headache(784.0)   . Anemia   . Depression   . Anxiety   . Polycystic ovarian syndrome     diagnosed at very early age  . Abdominal pain    Past Surgical History  Procedure Laterality Date  . Tonsillectomy  1983  . Adenoidectomy    . Gastrojejunostomy  09/06/2011    Procedure: LAPAROSCOPIC GASTROJEJUNOSTOMY;  Surgeon: Mariella Saa, MD;  Location: [facility];  Service: General;  Laterality: N/A;  . Esophagogastroduodenoscopy  09/03/2011    Procedure: ESOPHAGOGASTRODUODENOSCOPY (EGD);  Surgeon: Kandis Cocking, MD;  Location: [facility];  Service: General;  Laterality: N/A;  . Appendectomy  1996  . Cholecystectomy  1998  . Esophagogastroduodenoscopy N/A 01/17/2013    Procedure: ESOPHAGOGASTRODUODENOSCOPY (EGD);  Surgeon: Beverley Fiedler, MD;  Location: [facility];  Service: Gastroenterology;  Laterality: N/A;  Pat   Family History  Problem Relation Age of Onset  . Cancer Mother     breast  . Other Father     cerebal vasculitis  . Cancer Maternal Grandmother     skin  . Cancer Maternal Grandfather     lung   Social History  Substance Use Topics  . Smoking status: Never Smoker   . Smokeless tobacco: Never Used  . Alcohol Use: No   OB History    No data available     Review of Systems cough Allergies  Iodine; Shellfish-derived products; Adhesive; Contrast media; Latex; Topiramate; Lamictal; and Penicillins  Home Medications   Prior to  Admission medications   Medication Sig Start Date End Date Taking? Authorizing Provider  chlorpheniramine-HYDROcodone (TUSSIONEX PENNKINETIC ER) 10-8 MG/5ML SUER Take 5 mLs by mouth 2 (two) times daily. 01/16/16   Tharon Aquas, PA  Cholecalciferol (VITAMIN D3) 5000 UNITS CAPS Take 1 tablet by mouth daily.    Historical Provider, MD  diclofenac (VOLTAREN) 75 MG EC tablet Take 1 tablet (75 mg total) by mouth 2 (two) times daily. 06/10/13   Gwenlyn Found Copland, MD  imipramine (TOFRANIL-PM) 150 MG capsule Take 150 mg by mouth at bedtime.    Historical Provider, MD  levothyroxine (SYNTHROID, LEVOTHROID) 100 MCG tablet Take 100 mcg by mouth daily.    Historical Provider, MD  metFORMIN (GLUCOPHAGE) 500 MG tablet Take 500 mg by mouth daily.      Historical Provider, MD  metoCLOPramide (REGLAN) 5 MG tablet Take 1 tablet (5 mg total) by mouth 3 (three) times daily before meals. 01/21/13   Glenna Fellows, MD  Multiple Vitamins-Minerals (MULTIVITAMINS THER. W/MINERALS) TABS Take 1 tablet by mouth daily.      Historical Provider, MD  pantoprazole (PROTONIX) 40 MG tablet Take 1 tablet (40 mg total) by mouth daily. 11/21/11 11/20/12  Glenna Fellows, MD  promethazine (PHENERGAN) 12.5 MG tablet Take 1 tablet (12.5 mg total) by mouth every 6 (six) hours as needed for nausea. 01/21/13   Glenna Fellows, MD  QUEtiapine (SEROQUEL) 400 MG tablet Take 400 mg by mouth at bedtime.  Historical Provider, MD   Meds Ordered and Administered this Visit  Medications - No data to display  BP 137/93 mmHg  Pulse 123  Temp(Src) 98.5 F (36.9 C) (Oral)  Resp 16  SpO2 99% No data found.   Physical Exam NURSES NOTES AND VITAL SIGNS REVIEWED. CONSTITUTIONAL: Well developed, well nourished, no acute distress HEENT: normocephalic, atraumatic, right and left TM's are normal EYES: Conjunctiva normal NECK:normal ROM, supple, no adenopathy PULMONARY:No respiratory distress, normal effort, Lungs: CTAb/l, no wheezes, or  increased work of breathing CARDIOVASCULAR: RRR, no murmur ABDOMEN: soft, ND, NT, +'ve BS MUSCULOSKELETAL: Normal ROM of all extremities,  SKIN: warm and dry without rash PSYCHIATRIC: Mood and affect, behavior are normal  ED Course  Procedures (including critical care time)  Labs Review Labs Reviewed - No data to display  Imaging Review No results found.   Visual Acuity Review  Right Eye Distance:   Left Eye Distance:   Bilateral Distance:    Right Eye Near:   Left Eye Near:    Bilateral Near:       Discussed treatment options.   Rx for tussionex MDM   1. Coughing      Patient is reassured that there are no issues that require transfer to higher level of care at this time or additional tests. Patient is advised to continue home symptomatic treatment. Patient is advised that if there are new or worsening symptoms to attend the emergency department, contact primary care provider, or return to UC. Instructions of care provided discharged home in stable condition. Return to work/school note provided.   THIS NOTE WAS GENERATED USING A VOICE RECOGNITION SOFTWARE PROGRAM. ALL REASONABLE EFFORTS  WERE MADE TO PROOFREAD THIS DOCUMENT FOR ACCURACY.  I have verbally reviewed the discharge instructions with the patient. A printed AVS was given to the patient.  All questions were answered prior to discharge.      Tharon AquasFrank C Cyntha Brickman, PA 01/17/16 0930

## 2016-01-19 ENCOUNTER — Encounter (HOSPITAL_COMMUNITY): Payer: Self-pay | Admitting: Emergency Medicine

## 2016-01-19 ENCOUNTER — Emergency Department (HOSPITAL_COMMUNITY)
Admission: EM | Admit: 2016-01-19 | Discharge: 2016-01-19 | Disposition: A | Discharge status: Home / Self Care | Payer: Managed Care, Other (non HMO) | Attending: Emergency Medicine | Admitting: Emergency Medicine

## 2016-01-19 ENCOUNTER — Emergency Department (HOSPITAL_COMMUNITY): Payer: Managed Care, Other (non HMO)

## 2016-01-19 DIAGNOSIS — R05 Cough: Secondary | ICD-10-CM | POA: Diagnosis not present

## 2016-01-19 DIAGNOSIS — R6889 Other general symptoms and signs: Secondary | ICD-10-CM

## 2016-01-19 DIAGNOSIS — Z8719 Personal history of other diseases of the digestive system: Secondary | ICD-10-CM | POA: Diagnosis not present

## 2016-01-19 DIAGNOSIS — Z79899 Other long term (current) drug therapy: Secondary | ICD-10-CM | POA: Insufficient documentation

## 2016-01-19 DIAGNOSIS — Z880 Allergy status to penicillin: Secondary | ICD-10-CM | POA: Insufficient documentation

## 2016-01-19 DIAGNOSIS — R509 Fever, unspecified: Secondary | ICD-10-CM | POA: Diagnosis not present

## 2016-01-19 DIAGNOSIS — J029 Acute pharyngitis, unspecified: Secondary | ICD-10-CM | POA: Diagnosis not present

## 2016-01-19 DIAGNOSIS — R112 Nausea with vomiting, unspecified: Secondary | ICD-10-CM | POA: Insufficient documentation

## 2016-01-19 DIAGNOSIS — R51 Headache: Secondary | ICD-10-CM | POA: Insufficient documentation

## 2016-01-19 DIAGNOSIS — E039 Hypothyroidism, unspecified: Secondary | ICD-10-CM | POA: Insufficient documentation

## 2016-01-19 DIAGNOSIS — Z9049 Acquired absence of other specified parts of digestive tract: Secondary | ICD-10-CM | POA: Insufficient documentation

## 2016-01-19 DIAGNOSIS — G8929 Other chronic pain: Secondary | ICD-10-CM | POA: Diagnosis not present

## 2016-01-19 DIAGNOSIS — Z91040 Latex allergy status: Secondary | ICD-10-CM | POA: Diagnosis not present

## 2016-01-19 DIAGNOSIS — M791 Myalgia: Secondary | ICD-10-CM | POA: Diagnosis not present

## 2016-01-19 DIAGNOSIS — R109 Unspecified abdominal pain: Secondary | ICD-10-CM | POA: Diagnosis not present

## 2016-01-19 DIAGNOSIS — M2550 Pain in unspecified joint: Secondary | ICD-10-CM | POA: Insufficient documentation

## 2016-01-19 DIAGNOSIS — Z3202 Encounter for pregnancy test, result negative: Secondary | ICD-10-CM | POA: Insufficient documentation

## 2016-01-19 DIAGNOSIS — R0981 Nasal congestion: Secondary | ICD-10-CM | POA: Diagnosis not present

## 2016-01-19 DIAGNOSIS — D649 Anemia, unspecified: Secondary | ICD-10-CM | POA: Insufficient documentation

## 2016-01-19 DIAGNOSIS — Z791 Long term (current) use of non-steroidal anti-inflammatories (NSAID): Secondary | ICD-10-CM | POA: Diagnosis not present

## 2016-01-19 LAB — CBC WITH DIFFERENTIAL/PLATELET
BASOS ABS: 0 10*3/uL (ref 0.0–0.1)
Basophils Relative: 0 %
EOS ABS: 0.4 10*3/uL (ref 0.0–0.7)
Eosinophils Relative: 4 %
HCT: 40.6 % (ref 36.0–46.0)
HEMOGLOBIN: 12.7 g/dL (ref 12.0–15.0)
LYMPHS ABS: 3.1 10*3/uL (ref 0.7–4.0)
Lymphocytes Relative: 31 %
MCH: 28.5 pg (ref 26.0–34.0)
MCHC: 31.3 g/dL (ref 30.0–36.0)
MCV: 91 fL (ref 78.0–100.0)
Monocytes Absolute: 0.7 10*3/uL (ref 0.1–1.0)
Monocytes Relative: 7 %
NEUTROS PCT: 58 %
Neutro Abs: 5.8 10*3/uL (ref 1.7–7.7)
Platelets: 424 10*3/uL — ABNORMAL HIGH (ref 150–400)
RBC: 4.46 MIL/uL (ref 3.87–5.11)
RDW: 13.9 % (ref 11.5–15.5)
WBC: 10.1 10*3/uL (ref 4.0–10.5)

## 2016-01-19 LAB — URINALYSIS, ROUTINE W REFLEX MICROSCOPIC
BILIRUBIN URINE: NEGATIVE
GLUCOSE, UA: NEGATIVE mg/dL
HGB URINE DIPSTICK: NEGATIVE
Ketones, ur: NEGATIVE mg/dL
Leukocytes, UA: NEGATIVE
Nitrite: NEGATIVE
PROTEIN: NEGATIVE mg/dL
SPECIFIC GRAVITY, URINE: 1.011 (ref 1.005–1.030)
pH: 7 (ref 5.0–8.0)

## 2016-01-19 LAB — POC URINE PREG, ED: PREG TEST UR: NEGATIVE

## 2016-01-19 LAB — I-STAT CG4 LACTIC ACID, ED: Lactic Acid, Venous: 1.52 mmol/L (ref 0.5–2.0)

## 2016-01-19 LAB — COMPREHENSIVE METABOLIC PANEL
ALBUMIN: 3.9 g/dL (ref 3.5–5.0)
ALK PHOS: 100 U/L (ref 38–126)
ALT: 26 U/L (ref 14–54)
AST: 42 U/L — AB (ref 15–41)
Anion gap: 10 (ref 5–15)
BUN: 11 mg/dL (ref 6–20)
CALCIUM: 8.8 mg/dL — AB (ref 8.9–10.3)
CHLORIDE: 101 mmol/L (ref 101–111)
CO2: 29 mmol/L (ref 22–32)
CREATININE: 1.09 mg/dL — AB (ref 0.44–1.00)
GFR calc Af Amer: >60 mL/min (ref >60)
GFR calc non Af Amer: >60 mL/min (ref >60)
GLUCOSE: 96 mg/dL (ref 65–99)
Potassium: 4.3 mmol/L (ref 3.5–5.1)
SODIUM: 140 mmol/L (ref 135–145)
Total Bilirubin: 0.5 mg/dL (ref 0.3–1.2)
Total Protein: 6.8 g/dL (ref 6.5–8.1)

## 2016-01-19 LAB — LIPASE, BLOOD: Lipase: 29 U/L (ref 11–51)

## 2016-01-19 MED ORDER — KETOROLAC TROMETHAMINE 30 MG/ML IJ SOLN
30.0000 mg | Freq: Once | INTRAMUSCULAR | Status: AC
  Administered 2016-01-19: 30 mg via INTRAVENOUS
  Filled 2016-01-19: qty 1

## 2016-01-19 MED ORDER — ONDANSETRON HCL 4 MG/2ML IJ SOLN
4.0000 mg | Freq: Once | INTRAMUSCULAR | Status: AC
  Administered 2016-01-19: 4 mg via INTRAVENOUS
  Filled 2016-01-19: qty 2

## 2016-01-19 MED ORDER — SODIUM CHLORIDE 0.9 % IV BOLUS (SEPSIS)
1000.0000 mL | Freq: Once | INTRAVENOUS | Status: AC
  Administered 2016-01-19: 1000 mL via INTRAVENOUS

## 2016-01-19 NOTE — Discharge Instructions (Signed)
Continue Tylenol for pain and any fever. Make sure to drink plenty of fluids to prevent dehydration. Follow with primary care doctor and with cancer Center. Return if worsening.  Influenza, Adult Influenza ("the flu") is a viral infection of the respiratory tract. It occurs more often in winter months because people spend more time in close contact with one another. Influenza can make you feel very sick. Influenza easily spreads from person to person (contagious). CAUSES  Influenza is caused by a virus that infects the respiratory tract. You can catch the virus by breathing in droplets from an infected person's cough or sneeze. You can also catch the virus by touching something that was recently contaminated with the virus and then touching your mouth, nose, or eyes. RISKS AND COMPLICATIONS You may be at risk for a more severe case of influenza if you smoke cigarettes, have diabetes, have chronic heart disease (such as heart failure) or lung disease (such as asthma), or if you have a weakened immune system. Elderly people and pregnant women are also at risk for more serious infections. The most common problem of influenza is a lung infection (pneumonia). Sometimes, this problem can require emergency medical care and may be life threatening. SIGNS AND SYMPTOMS  Symptoms typically last 4 to 10 days and may include:  Fever.  Chills.  Headache, body aches, and muscle aches.  Sore throat.  Chest discomfort and cough.  Poor appetite.  Weakness or feeling tired.  Dizziness.  Nausea or vomiting. DIAGNOSIS  Diagnosis of influenza is often made based on your history and a physical exam. A nose or throat swab test can be done to confirm the diagnosis. TREATMENT  In mild cases, influenza goes away on its own. Treatment is directed at relieving symptoms. For more severe cases, your health care provider may prescribe antiviral medicines to shorten the sickness. Antibiotic medicines are not effective  because the infection is caused by a virus, not by bacteria. HOME CARE INSTRUCTIONS  Take medicines only as directed by your health care provider.  Use a cool mist humidifier to make breathing easier.  Get plenty of rest until your temperature returns to normal. This usually takes 3 to 4 days.  Drink enough fluid to keep your urine clear or pale yellow.  Cover yourmouth and nosewhen coughing or sneezing,and wash your handswellto prevent thevirusfrom spreading.  Stay homefromwork orschool untilthe fever is gonefor at least 531full day. PREVENTION  An annual influenza vaccination (flu shot) is the best way to avoid getting influenza. An annual flu shot is now routinely recommended for all adults in the [place] SEEK MEDICAL CARE IF:  You experiencechest pain, yourcough worsens,or you producemore mucus.  Youhave nausea,vomiting, ordiarrhea.  Your fever returns or gets worse. SEEK IMMEDIATE MEDICAL CARE IF:  You havetrouble breathing, you become short of breath,or your skin ornails becomebluish.  You have severe painor stiffnessin the neck.  You develop a sudden headache, or pain in the face or ear.  You have nausea or vomiting that you cannot control. MAKE SURE YOU:   Understand these instructions.  Will watch your condition.  Will get help right away if you are not doing well or get worse.   This information is not intended to replace advice given to you by your health care provider. Make sure you discuss any questions you have with your health care provider.   Document Released: 10/12/2000 Document Revised: 11/05/2014 Document Reviewed: 01/14/2012 Elsevier Interactive Patient Education [employer]2016 [employer]

## 2016-01-19 NOTE — ED Provider Notes (Signed)
CSN: [id number]     Arrival date & time 01/19/16  1147 History   First MD Initiated Contact with Patient 01/19/16 1409     Chief Complaint  Patient presents with  . Bleeding/Bruising  . Influenza     (Consider location/radiation/quality/duration/timing/severity/associated sxs/prior Treatment) HPI [name] is a 35 y.o. female with history of acid reflux, chronic myeloid leukemia, anemia, anxiety, presents to emergency department complaining of persistent body aches, URI symptoms. Patient states she initially developed body aches and generalized weakness approximately 4 weeks ago. She states about a week ago she started having nasal congestion, sore throat, fever up to 101, cough. She was seen at urgent care and was tested positive for influenza, or so she thinks. She states she was placed on Tamiflu which she is still taking. She has been taking ibuprofen for her symptoms. She states that her symptoms are not improving. She states the main thing that bothers her is these body aches. Admits to some nausea and vomiting. No diarrhea. She reports some chronic abdominal pain. Denies any urinary symptoms. No vaginal discharge or bleeding. She reports some left flank pain. She also states she noticed new bruising to the left flank and bilateral legs. She denies any injuries.   Past Medical History  Diagnosis Date  . Hypothyroidism   . Blood transfusion   . GERD (gastroesophageal reflux disease)   . Headache(784.0)   . Anemia   . Depression   . Anxiety   . Polycystic ovarian syndrome     diagnosed at very early age  . Abdominal pain    Past Surgical History  Procedure Laterality Date  . Tonsillectomy  1983  . Adenoidectomy    . Gastrojejunostomy  09/06/2011    Procedure: LAPAROSCOPIC GASTROJEJUNOSTOMY;  Surgeon: Mariella SaaBenjamin T Hoxworth, MD;  Location: [facility];  Service: General;  Laterality: N/A;  . Esophagogastroduodenoscopy  09/03/2011    Procedure: ESOPHAGOGASTRODUODENOSCOPY (EGD);   Surgeon: Kandis Cockingavid H Newman, MD;  [facility];  Service: General;  Laterality: N/A;  . Appendectomy  1996  . Cholecystectomy  1998  . Esophagogastroduodenoscopy N/A 01/17/2013    Procedure: ESOPHAGOGASTRODUODENOSCOPY (EGD);  Surgeon: Beverley FiedlerJay M Pyrtle, MD;  [facility];  Service: Gastroenterology;  Laterality: N/A;  Pat   Family History  Problem Relation Age of Onset  . Cancer Mother     breast  . Other Father     cerebal vasculitis  . Cancer Maternal Grandmother     skin  . Cancer Maternal Grandfather     lung   Social History  Substance Use Topics  . Smoking status: Never Smoker   . Smokeless tobacco: Never Used  . Alcohol Use: No   OB History    No data available     Review of Systems  Constitutional: Positive for fever and chills.  HENT: Positive for congestion and sore throat.   Respiratory: Positive for cough. Negative for chest tightness and shortness of breath.   Cardiovascular: Negative for chest pain, palpitations and leg swelling.  Gastrointestinal: Positive for nausea, vomiting and abdominal pain. Negative for diarrhea.  Genitourinary: Negative for dysuria, flank pain and pelvic pain.  Musculoskeletal: Positive for myalgias and arthralgias. Negative for neck pain and neck stiffness.  Skin: Negative for rash.  Neurological: Positive for headaches. Negative for dizziness and weakness.  All other systems reviewed and are negative.     Allergies  Iodine; Penicillins; Shellfish-derived products; Adhesive; Contrast media; Latex; Pertussis vaccines; Topiramate; Eszopiclone; Lamictal; Metoclopramide; and Zolpidem  Home Medications  Prior to Admission medications   Medication Sig Start Date End Date Taking? Authorizing Provider  chlorpheniramine-HYDROcodone (TUSSIONEX PENNKINETIC ER) 10-8 MG/5ML SUER Take 5 mLs by mouth 2 (two) times daily. 01/16/16  Yes Tharon Aquas, PA  cyanocobalamin (,VITAMIN B-12,) 1000 MCG/ML injection Inject 1,000 mcg into  the muscle every 30 (thirty) days. 12/18/15  Yes Historical Provider, MD  dasatinib (SPRYCEL) 140 MG tablet Take 140 mg by mouth daily.   Yes Historical Provider, MD  diclofenac (VOLTAREN) 75 MG EC tablet Take 1 tablet (75 mg total) by mouth 2 (two) times daily. 06/10/13  Yes Gwenlyn Found Copland, MD  INTERFERON ALFA-2A [place] Inject 1 Dose into the skin daily.   Yes Historical Provider, MD  Iron TABS Take 1 tablet by mouth daily.   Yes Historical Provider, MD  levothyroxine (SYNTHROID) 125 MCG tablet Take 125 mcg by mouth daily before breakfast.   Yes Historical Provider, MD  Multiple Vitamins-Minerals (MULTIVITAMINS THER. W/MINERALS) TABS Take 1 tablet by mouth daily.     Yes Historical Provider, MD  naproxen sodium (ANAPROX) 220 MG tablet Take 220 mg by mouth 2 (two) times daily as needed (pain).   Yes Historical Provider, MD  promethazine (PHENERGAN) 12.5 MG tablet Take 1 tablet (12.5 mg total) by mouth every 6 (six) hours as needed for nausea. 01/21/13  Yes Glenna Fellows, MD  promethazine (PHENERGAN) 25 MG tablet Take 25 mg by mouth every 4 (four) hours as needed for nausea or vomiting.  10/20/15  Yes Historical Provider, MD  Vitamin D, Ergocalciferol, (DRISDOL) 50000 units CAPS capsule Take 100,000 Units by mouth every 7 (seven) days.   Yes Historical Provider, MD   BP 116/80 mmHg  Pulse 96  Temp(Src) 98 F (36.7 C) (Oral)  Resp 16  SpO2 98%  LMP 12/28/2015 Physical Exam  Constitutional: She is oriented to person, place, and time. She appears well-developed and well-nourished. No distress.  HENT:  Head: Normocephalic.  Right Ear: External ear normal.  Left Ear: External ear normal.  Nose: Nose normal.  Mouth/Throat: Oropharynx is clear and moist.  Eyes: Conjunctivae are normal.  Neck: Normal range of motion. Neck supple.  No meningismus  Cardiovascular: Normal rate, regular rhythm and normal heart sounds.   Pulmonary/Chest: Effort normal and breath sounds normal. No respiratory  distress. She has no wheezes. She has no rales.  Abdominal: Soft. Bowel sounds are normal. She exhibits no distension. There is no tenderness. There is no rebound.  Musculoskeletal: She exhibits no edema.  Neurological: She is alert and oriented to person, place, and time.  Skin: Skin is warm and dry.  Psychiatric: She has a normal mood and affect. Her behavior is normal.  Nursing note and vitals reviewed.   ED Course  Procedures (including critical care time) Labs Review Labs Reviewed  CBC WITH DIFFERENTIAL/PLATELET - Abnormal; Notable for the following:    Platelets 424 (*)    All other components within normal limits  COMPREHENSIVE METABOLIC PANEL - Abnormal; Notable for the following:    Creatinine, Ser 1.09 (*)    Calcium 8.8 (*)    AST 42 (*)    All other components within normal limits  URINALYSIS, ROUTINE W REFLEX MICROSCOPIC (NOT AT [facility])  LIPASE, BLOOD  I-STAT CG4 LACTIC ACID, ED  POC URINE PREG, ED    Imaging Review Dg Chest 2 View  01/19/2016  CLINICAL DATA:  Fever with cough and shortness of breath EXAM: CHEST  2 VIEW COMPARISON:  January 16, 2013 FINDINGS: Lungs  are clear. Heart size and pulmonary vascularity are normal. No adenopathy. No bone lesions. IMPRESSION: No edema or consolidation. Electronically Signed   By: Bretta Bang III M.D.   On: 01/19/2016 15:01   I have personally reviewed and evaluated these images and lab results as part of my medical decision-making.   EKG Interpretation None      MDM   Final diagnoses:  Flu-like symptoms   Patient with history of CML, presented to emergency department with body aches for 4 weeks and flulike symptoms for a week. She states she was tested positive for influenza one week ago, however I'm unable to verify that from the notes in the chart or from the provider's note at that visit. She is on Tamiflu. She is here because her symptoms are not improving. Patient is afebrile, temperature is 90.8 here. Normal  vital signs including heart rate and blood pressure. Not respiratory distress. Will check labs, chest x-ray, hydrate with IV fluids.  3:16 PM Patient requesting something for nausea and pain. Order Zofran and Toradol. Will reassess.  4:11 PM Appears to be at baseline. Vital signs are normal, patient is not hypotensive or tachycardic, respiratory rate is 16, she is afebrile. She is nontoxic appearing. Chest is negative. Urinalysis negative. Most likely still influenza symptoms. No evidence of sepsis. No other findings. Stable for dc home. Pt moving here from [place], asked for oncologist, will refer to cancer center.   Filed Vitals:   01/19/16 1207 01/19/16 1525  BP: 116/80 121/82  Pulse: 96 80  Temp: 98 F (36.7 C)   TempSrc: Oral   Resp: 16 16  SpO2: 98% 95%     Jaynie Crumble, PA-C 01/19/16 1614  Azalia Bilis, MD 01/19/16 [phone]

## 2016-01-19 NOTE — ED Notes (Signed)
Hx of chronic myeloid leukemia. Dx with flu last week. States over the last week her symptoms have not improved (cough, runny nose, generalized body aches, nausea, vomiting), but now having mild bruising in abdomin and legs. States she's never had abdominal bruising in the past.

## 2016-01-31 ENCOUNTER — Telehealth: Payer: Self-pay | Admitting: *Deleted

## 2016-01-31 NOTE — Telephone Encounter (Signed)
Unable to reach patient at time of pre-visit call. Left message for patient to return call when available.  

## 2016-02-01 ENCOUNTER — Ambulatory Visit: Payer: Managed Care, Other (non HMO) | Admitting: Family Medicine

## 2016-05-09 ENCOUNTER — Encounter (HOSPITAL_COMMUNITY): Payer: Self-pay | Admitting: *Deleted

## 2016-05-09 ENCOUNTER — Ambulatory Visit (HOSPITAL_COMMUNITY)
Admission: EM | Admit: 2016-05-09 | Discharge: 2016-05-09 | Disposition: A | Discharge status: Home / Self Care | Payer: Managed Care, Other (non HMO) | Attending: Family Medicine | Admitting: Family Medicine

## 2016-05-09 DIAGNOSIS — G43009 Migraine without aura, not intractable, without status migrainosus: Secondary | ICD-10-CM | POA: Diagnosis not present

## 2016-05-09 MED ORDER — KETOROLAC TROMETHAMINE 60 MG/2ML IM SOLN
INTRAMUSCULAR | Status: AC
  Filled 2016-05-09: qty 2

## 2016-05-09 MED ORDER — DIPHENHYDRAMINE HCL 50 MG/ML IJ SOLN
25.0000 mg | Freq: Once | INTRAMUSCULAR | Status: AC
  Administered 2016-05-09: 25 mg via INTRAMUSCULAR

## 2016-05-09 MED ORDER — DEXAMETHASONE SODIUM PHOSPHATE 10 MG/ML IJ SOLN: 10.0000 mg | Freq: Once | INTRAMUSCULAR | Status: DC

## 2016-05-09 MED ORDER — DEXAMETHASONE SODIUM PHOSPHATE 10 MG/ML IJ SOLN
INTRAMUSCULAR | Status: AC
  Filled 2016-05-09: qty 1

## 2016-05-09 MED ORDER — DEXAMETHASONE SODIUM PHOSPHATE 10 MG/ML IJ SOLN
10.0000 mg | Freq: Once | INTRAMUSCULAR | Status: AC
  Administered 2016-05-09: 10 mg via INTRAMUSCULAR

## 2016-05-09 MED ORDER — KETOROLAC TROMETHAMINE 60 MG/2ML IM SOLN
60.0000 mg | Freq: Once | INTRAMUSCULAR | Status: AC
  Administered 2016-05-09: 60 mg via INTRAMUSCULAR

## 2016-05-09 MED ORDER — DIPHENHYDRAMINE HCL 50 MG/ML IJ SOLN
INTRAMUSCULAR | Status: AC
  Filled 2016-05-09: qty 1

## 2016-05-09 NOTE — ED Notes (Signed)
Pt  Reports     Symptoms  Of  Headache     dizzyness        X  24  Hours     With  Some  Nausea   / vomiting  / photophobia         pt  States  She  Has  A  History  Of  Migraines  In the    Past

## 2016-05-09 NOTE — ED Provider Notes (Signed)
CSN: [id number]     Arrival date & time 05/09/16  1054 History   First MD Initiated Contact with Patient 05/09/16 1204     Chief Complaint  Patient presents with  . Migraine   (Consider location/radiation/quality/duration/timing/severity/associated sxs/prior Treatment) HPI History obtained from patient:  Pt presents with the cc of:  Migraine headache Duration of symptoms: 2 days Treatment prior to arrival: Excedrin migraine headache medicine and over-the-counter meds without relief of symptoms Context: Sudden onset of a migraine headache 2 days ago not getting any better, feels more nauseous. Usually receives headache cocktail if she needs to come to provider for help. Other symptoms include: Nausea and dizziness Pain score: 3 FAMILY HISTORY: Headaches    Past Medical History  Diagnosis Date  . Hypothyroidism   . Blood transfusion   . GERD (gastroesophageal reflux disease)   . Headache(784.0)   . Anemia   . Depression   . Anxiety   . Polycystic ovarian syndrome     diagnosed at very early age  . Abdominal pain    Past Surgical History  Procedure Laterality Date  . Tonsillectomy  1983  . Adenoidectomy    . Gastrojejunostomy  09/06/2011    Procedure: LAPAROSCOPIC GASTROJEJUNOSTOMY;  Surgeon: Mariella SaaBenjamin T Hoxworth, MD;  Location: [facility];  Service: General;  Laterality: N/A;  . Esophagogastroduodenoscopy  09/03/2011    Procedure: ESOPHAGOGASTRODUODENOSCOPY (EGD);  Surgeon: Kandis Cockingavid H Newman, MD;  [facility];  Service: General;  Laterality: N/A;  . Appendectomy  1996  . Cholecystectomy  1998  . Esophagogastroduodenoscopy N/A 01/17/2013    Procedure: ESOPHAGOGASTRODUODENOSCOPY (EGD);  Surgeon: Beverley FiedlerJay M Pyrtle, MD;  [facility];  Service: Gastroenterology;  Laterality: N/A;  Pat   Family History  Problem Relation Age of Onset  . Cancer Mother     breast  . Other Father     cerebal vasculitis  . Cancer Maternal Grandmother     skin  . Cancer Maternal  Grandfather     lung   Social History  Substance Use Topics  . Smoking status: Never Smoker   . Smokeless tobacco: Never Used  . Alcohol Use: No   OB History    No data available     Review of Systems  Denies:   ABDOMINAL PAIN, CHEST PAIN, CONGESTION, DYSURIA, SHORTNESS OF BREATH  Allergies  Iodine; Penicillins; Shellfish-derived products; Adhesive; Contrast media; Latex; Pertussis vaccines; Topiramate; Eszopiclone; Lamictal; Metoclopramide; and Zolpidem  Home Medications   Prior to Admission medications   Medication Sig Start Date End Date Taking? Authorizing Provider  chlorpheniramine-HYDROcodone (TUSSIONEX PENNKINETIC ER) 10-8 MG/5ML SUER Take 5 mLs by mouth 2 (two) times daily. 01/16/16   Tharon AquasFrank C Ahilyn Nell, PA  cyanocobalamin (,VITAMIN B-12,) 1000 MCG/ML injection Inject 1,000 mcg into the muscle every 30 (thirty) days. 12/18/15   Historical Provider, MD  dasatinib (SPRYCEL) 140 MG tablet Take 140 mg by mouth daily.    Historical Provider, MD  diclofenac (VOLTAREN) 75 MG EC tablet Take 1 tablet (75 mg total) by mouth 2 (two) times daily. 06/10/13   Gwenlyn FoundJessica C Copland, MD  INTERFERON ALFA-2A [place] Inject 1 Dose into the skin daily.    Historical Provider, MD  Iron TABS Take 1 tablet by mouth daily.    Historical Provider, MD  levothyroxine (SYNTHROID) 125 MCG tablet Take 125 mcg by mouth daily before breakfast.    Historical Provider, MD  Multiple Vitamins-Minerals (MULTIVITAMINS THER. W/MINERALS) TABS Take 1 tablet by mouth daily.      Historical Provider,  MD  naproxen sodium (ANAPROX) 220 MG tablet Take 220 mg by mouth 2 (two) times daily as needed (pain).    Historical Provider, MD  promethazine (PHENERGAN) 12.5 MG tablet Take 1 tablet (12.5 mg total) by mouth every 6 (six) hours as needed for nausea. 01/21/13   Glenna Fellows, MD  promethazine (PHENERGAN) 25 MG tablet Take 25 mg by mouth every 4 (four) hours as needed for nausea or vomiting.  10/20/15   Historical Provider, MD   Vitamin D, Ergocalciferol, (DRISDOL) 50000 units CAPS capsule Take 100,000 Units by mouth every 7 (seven) days.    Historical Provider, MD   Meds Ordered and Administered this Visit   Medications  ketorolac (TORADOL) injection 60 mg (60 mg Intramuscular Given 05/09/16 1256)  diphenhydrAMINE (BENADRYL) injection 25 mg (25 mg Intramuscular Given 05/09/16 1257)  dexamethasone (DECADRON) injection 10 mg (10 mg Intramuscular Given 05/09/16 1300)   Feeling better after administration of medications. BP 125/89 mmHg  Pulse 71  Temp(Src) 99.2 F (37.3 C) (Oral)  Resp 12  SpO2 99%  LMP 04/07/2016 No data found.   Physical Exam  NURSES NOTES AND VITAL SIGNS REVIEWED. CONSTITUTIONAL: Well developed, well nourished, no acute distress HEENT: normocephalic, atraumatic, right and left TM's are normal EYES: Conjunctiva normal NECK:normal ROM, supple, no adenopathy PULMONARY:No respiratory distress, normal effort, Lungs: CTAb/l, no wheezes, or increased work of breathing CARDIOVASCULAR: RRR, no murmur ABDOMEN: soft, ND, NT, +'ve BS MUSCULOSKELETAL: Normal ROM of all extremities,  SKIN: warm and dry without rash PSYCHIATRIC: Mood and affect, behavior are normal NEUROLOGICAL SCREENING EXAM: Constitutional:   oriented to person, place, and time.  Neurological:  normal strength and normal reflexes. No cranial nerve deficit or sensory deficit.  negative Romberg sign. GCS eye subscore is 4. GCS verbal subscore is 5. GCS motor subscore is 6   ED Course  Procedures (including critical care time)  Labs Review Labs Reviewed - No data to display  Imaging Review No results found.   Visual Acuity Review  Right Eye Distance:   Left Eye Distance:   Bilateral Distance:    Right Eye Near:   Left Eye Near:    Bilateral Near:         MDM   1. Migraine without aura and without status migrainosus, not intractable     Patient is reassured that there are no issues that require transfer to  higher level of care at this time or additional tests. Patient is advised to continue home symptomatic treatment. Patient is advised that if there are new or worsening symptoms to attend the emergency department, contact primary care provider, or return to UC. Instructions of care provided discharged home in stable condition.    THIS NOTE WAS GENERATED USING A VOICE RECOGNITION SOFTWARE PROGRAM. ALL REASONABLE EFFORTS  WERE MADE TO PROOFREAD THIS DOCUMENT FOR ACCURACY.  I have verbally reviewed the discharge instructions with the patient. A printed AVS was given to the patient.  All questions were answered prior to discharge.      Tharon Aquas, PA 05/09/16 1352

## 2016-05-09 NOTE — Discharge Instructions (Signed)
Recurrent Migraine Headache A migraine headache is an intense, throbbing pain on one or both sides of your head. Recurrent migraines keep coming back. A migraine can last for 30 minutes to several hours. CAUSES  The exact cause of a migraine headache is not always known. However, a migraine may be caused when nerves in the brain become irritated and release chemicals that cause inflammation. This causes pain. Certain things may also trigger migraines, such as:   Alcohol.  Smoking.  Stress.  Menstruation.  Aged cheeses.  Foods or drinks that contain nitrates, glutamate, aspartame, or tyramine.  Lack of sleep.  Chocolate.  Caffeine.  Hunger.  Physical exertion.  Fatigue.  Medicines used to treat chest pain (nitroglycerine), birth control pills, estrogen, and some blood pressure medicines. SYMPTOMS   Pain on one or both sides of your head.  Pulsating or throbbing pain.  Severe pain that prevents daily activities.  Pain that is aggravated by any physical activity.  Nausea, vomiting, or both.  Dizziness.  Pain with exposure to bright lights, loud noises, or activity.  General sensitivity to bright lights, loud noises, or smells. Before you get a migraine, you may get warning signs that a migraine is coming (aura). An aura may include:  Seeing flashing lights.  Seeing bright spots, halos, or zigzag lines.  Having tunnel vision or blurred vision.  Having feelings of numbness or tingling.  Having trouble talking.  Having muscle weakness. DIAGNOSIS  A recurrent migraine headache is often diagnosed based on:  Symptoms.  Physical examination.  A CT scan or MRI of your head. These imaging tests cannot diagnose migraines but can help rule out other causes of headaches.  TREATMENT  Medicines may be given for pain and nausea. Medicines can also be given to help prevent recurrent migraines. HOME CARE INSTRUCTIONS  Only take over-the-counter or prescription  medicines for pain or discomfort as directed by your health care provider. The use of long-term narcotics is not recommended.  Lie down in a dark, quiet room when you have a migraine.  Keep a journal to find out what may trigger your migraine headaches. For example, write down:  What you eat and drink.  How much sleep you get.  Any change to your diet or medicines.  Limit alcohol consumption.  Quit smoking if you smoke.  Get 7-9 hours of sleep, or as recommended by your health care provider.  Limit stress.  Keep lights dim if bright lights bother you and make your migraines worse. SEEK MEDICAL CARE IF:   You do not get relief from the medicines given to you.  You have a recurrence of pain.  You have a fever. SEEK IMMEDIATE MEDICAL CARE IF:  Your migraine becomes severe.  You have a stiff neck.  You have loss of vision.  You have muscular weakness or loss of muscle control.  You start losing your balance or have trouble walking.  You feel faint or pass out.  You have severe symptoms that are different from your first symptoms. MAKE SURE YOU:   Understand these instructions.  Will watch your condition.  Will get help right away if you are not doing well or get worse.   This information is not intended to replace advice given to you by your health care provider. Make sure you discuss any questions you have with your health care provider.   Document Released: 07/10/2001 Document Revised: 11/05/2014 Document Reviewed: 06/22/2013 Elsevier Interactive Patient Education 2016 [employer]  

## 2016-10-29 ENCOUNTER — Encounter (HOSPITAL_COMMUNITY): Payer: Self-pay | Admitting: Emergency Medicine

## 2016-10-29 ENCOUNTER — Ambulatory Visit (HOSPITAL_COMMUNITY)
Admission: EM | Admit: 2016-10-29 | Discharge: 2016-10-29 | Disposition: A | Discharge status: Home / Self Care | Payer: Managed Care, Other (non HMO) | Attending: Family Medicine | Admitting: Family Medicine

## 2016-10-29 DIAGNOSIS — R059 Cough, unspecified: Secondary | ICD-10-CM

## 2016-10-29 DIAGNOSIS — R05 Cough: Secondary | ICD-10-CM | POA: Diagnosis not present

## 2016-10-29 MED ORDER — PREDNISONE 10 MG (21) PO TBPK: 10.0000 mg | ORAL_TABLET | Freq: Every day | ORAL | 0 refills | Status: DC

## 2016-10-29 MED ORDER — PROMETHAZINE-CODEINE 6.25-10 MG/5ML PO SYRP: 5.0000 mL | ORAL_SOLUTION | Freq: Four times a day (QID) | ORAL | 0 refills | Status: DC | PRN

## 2016-10-29 NOTE — Discharge Instructions (Signed)
You have been given a medicine for cough that causes drowsiness. Do not drink alcohol or operate heavy machinery while taking this medicine. You have also been given a steroid as well. Should your symptoms fail to improve or worsen follow up with your primary care provider or return to clinic.

## 2016-10-29 NOTE — ED Provider Notes (Signed)
CSN: [id number]     Arrival date & time 10/29/16  1437 History   None    Chief Complaint  Patient presents with  . URI   (Consider location/radiation/quality/duration/timing/severity/associated sxs/prior Treatment) 36 year old female presents to clinic with chief complaint of cough. Patient reports she had a URI the day after Christmas and was prescribed a zpak by her PCP. She reports all of her URI symptoms have resolved except cough and nausea. Cough has been non-productive, constant, without relief with the use of OTC medicines. She has been afebrile, sputum has been clear. She reports she is not asthmatic and does not smoke.   The history is provided by the patient.  URI  Presenting symptoms: cough     Past Medical History:  Diagnosis Date  . Abdominal pain   . Anemia   . Anxiety   . Blood transfusion   . Depression   . GERD (gastroesophageal reflux disease)   . Headache(784.0)   . Hypothyroidism   . Polycystic ovarian syndrome    diagnosed at very early age   Past Surgical History:  Procedure Laterality Date  . ADENOIDECTOMY    . APPENDECTOMY  1996  . CHOLECYSTECTOMY  1998  . ESOPHAGOGASTRODUODENOSCOPY  09/03/2011   Procedure: ESOPHAGOGASTRODUODENOSCOPY (EGD);  Surgeon: Kandis Cocking, MD;  Location: [facility];  Service: General;  Laterality: N/A;  . ESOPHAGOGASTRODUODENOSCOPY N/A 01/17/2013   Procedure: ESOPHAGOGASTRODUODENOSCOPY (EGD);  Surgeon: Beverley Fiedler, MD;  Location: [facility];  Service: Gastroenterology;  Laterality: N/A;  Pat  . GASTROJEJUNOSTOMY  09/06/2011   Procedure: LAPAROSCOPIC GASTROJEJUNOSTOMY;  Surgeon: Mariella Saa, MD;  Location: [facility];  Service: General;  Laterality: N/A;  . TONSILLECTOMY  1983   Family History  Problem Relation Age of Onset  . Cancer Mother     breast  . Other Father     cerebal vasculitis  . Cancer Maternal Grandmother     skin  . Cancer Maternal Grandfather     lung   Social History  Substance Use Topics   . Smoking status: Never Smoker  . Smokeless tobacco: Never Used  . Alcohol use No   OB History    No data available     Review of Systems  Constitutional: Negative.   HENT: Negative.   Eyes: Negative.   Respiratory: Positive for cough and shortness of breath.   Gastrointestinal: Positive for nausea. Negative for abdominal pain, diarrhea and vomiting.  Genitourinary: Negative.   Musculoskeletal: Negative.   Neurological: Negative.     Allergies  Iodine; Penicillins; Shellfish-derived products; Adhesive [tape]; Contrast media [iodinated diagnostic agents]; Latex; Pertussis vaccines; Topiramate [topamax]; Eszopiclone; Lamictal [lamotrigine]; Metoclopramide; and Zolpidem  Home Medications   Prior to Admission medications   Medication Sig Start Date End Date Taking? Authorizing Provider  cyanocobalamin (,VITAMIN B-12,) 1000 MCG/ML injection Inject 1,000 mcg into the muscle every 30 (thirty) days. 12/18/15  Yes Historical Provider, MD  Iron TABS Take 1 tablet by mouth daily.   Yes Historical Provider, MD  levothyroxine (SYNTHROID) 125 MCG tablet Take 125 mcg by mouth daily before breakfast.   Yes Historical Provider, MD  Multiple Vitamins-Minerals (MULTIVITAMINS THER. W/MINERALS) TABS Take 1 tablet by mouth daily.     Yes Historical Provider, MD  naproxen sodium (ANAPROX) 220 MG tablet Take 220 mg by mouth 2 (two) times daily as needed (pain).   Yes Historical Provider, MD  Vitamin D, Ergocalciferol, (DRISDOL) 50000 units CAPS capsule Take 100,000 Units by mouth every 7 (seven) days.  Yes Historical Provider, MD  chlorpheniramine-HYDROcodone (TUSSIONEX PENNKINETIC ER) 10-8 MG/5ML SUER Take 5 mLs by mouth 2 (two) times daily. 01/16/16   Tharon AquasFrank C Patrick, PA  dasatinib (SPRYCEL) 140 MG tablet Take 140 mg by mouth daily.    Historical Provider, MD  diclofenac (VOLTAREN) 75 MG EC tablet Take 1 tablet (75 mg total) by mouth 2 (two) times daily. 06/10/13   Gwenlyn FoundJessica C Copland, MD  INTERFERON  ALFA-2A [place] Inject 1 Dose into the skin daily.    Historical Provider, MD  predniSONE (STERAPRED UNI-PAK 21 TAB) 10 MG (21) TBPK tablet Take 1 tablet (10 mg total) by mouth daily. Take 6 tablets today then decrease by 1 tablet each day till finished (6,5,4,3,2,1) 10/29/16   Dorena BodoLawrence Kimberley Speece, NP  promethazine (PHENERGAN) 12.5 MG tablet Take 1 tablet (12.5 mg total) by mouth every 6 (six) hours as needed for nausea. 01/21/13   Glenna FellowsBenjamin Hoxworth, MD  promethazine (PHENERGAN) 25 MG tablet Take 25 mg by mouth every 4 (four) hours as needed for nausea or vomiting.  10/20/15   Historical Provider, MD  promethazine-codeine (PHENERGAN WITH CODEINE) 6.25-10 MG/5ML syrup Take 5 mLs by mouth every 6 (six) hours as needed for cough. 10/29/16   Dorena BodoLawrence Mileidy Atkin, NP   Meds Ordered and Administered this Visit  Medications - No data to display  BP 138/88 (BP Location: Right Arm)   Pulse 92   Temp 98.4 F (36.9 C) (Oral)   Resp 16   LMP 10/20/2016   SpO2 97%  No data found.   Physical Exam  Constitutional: She appears well-developed and well-nourished. She appears ill.  HENT:  Head: Normocephalic.  Right Ear: Hearing, tympanic membrane and external ear normal.  Left Ear: Hearing, tympanic membrane and external ear normal.  Eyes: Pupils are equal, round, and reactive to light.  Neck: Normal range of motion. No JVD present.  Cardiovascular: Normal rate and regular rhythm.   Pulmonary/Chest: Effort normal and breath sounds normal.  Abdominal: Soft. Bowel sounds are normal.  Lymphadenopathy:    She has no cervical adenopathy.  Neurological: She is alert.  Skin: Skin is warm and dry. Capillary refill takes less than 2 seconds. No erythema. No pallor.  Psychiatric: She has a normal mood and affect.  Nursing note and vitals reviewed.   Urgent Care Course   Clinical Course     Procedures (including critical care time)  Labs Review Labs Reviewed - No data to display  Imaging Review No results  found.   Visual Acuity Review  Right Eye Distance:   Left Eye Distance:   Bilateral Distance:    Right Eye Near:   Left Eye Near:    Bilateral Near:         MDM   1. Cough    Patient advised to drink plenty of fluids and rest, She was given an Rx for prednisone dose pack and phenergan with codeine. She was advised not to drink or drive while taking this medicine. Should symptoms worsen or fail to improve follow up with PCP or return to clinic.     Dorena BodoLawrence Dominigue Gellner, NP 10/29/16 [id number]

## 2016-10-29 NOTE — ED Triage Notes (Signed)
Here for cold like sx onset 1 week ++ associated w/dry cough, nasal congestion, HA  Denies fevers  PCP gave her a z-pack this week w/no relief.   A&O x4... NAD

## 2017-02-27 ENCOUNTER — Encounter (HOSPITAL_COMMUNITY): Payer: Self-pay | Admitting: *Deleted

## 2017-02-27 ENCOUNTER — Ambulatory Visit (HOSPITAL_COMMUNITY)
Admission: EM | Admit: 2017-02-27 | Discharge: 2017-02-27 | Disposition: A | Discharge status: Nursing Home (Includes ICF) | Payer: Managed Care, Other (non HMO) | Source: Home / Self Care

## 2017-02-27 ENCOUNTER — Emergency Department (HOSPITAL_COMMUNITY)
Admission: EM | Admit: 2017-02-27 | Discharge: 2017-02-27 | Disposition: A | Discharge status: Home / Self Care | Payer: Managed Care, Other (non HMO) | Attending: Emergency Medicine | Admitting: Emergency Medicine

## 2017-02-27 ENCOUNTER — Ambulatory Visit (HOSPITAL_COMMUNITY): Admission: RE | Admit: 2017-02-27 | Payer: Managed Care, Other (non HMO) | Source: Ambulatory Visit

## 2017-02-27 ENCOUNTER — Emergency Department (HOSPITAL_COMMUNITY): Payer: Managed Care, Other (non HMO)

## 2017-02-27 DIAGNOSIS — N3000 Acute cystitis without hematuria: Secondary | ICD-10-CM | POA: Insufficient documentation

## 2017-02-27 DIAGNOSIS — E860 Dehydration: Secondary | ICD-10-CM | POA: Diagnosis not present

## 2017-02-27 DIAGNOSIS — N390 Urinary tract infection, site not specified: Secondary | ICD-10-CM

## 2017-02-27 DIAGNOSIS — K5900 Constipation, unspecified: Secondary | ICD-10-CM

## 2017-02-27 DIAGNOSIS — E039 Hypothyroidism, unspecified: Secondary | ICD-10-CM | POA: Diagnosis not present

## 2017-02-27 DIAGNOSIS — N23 Unspecified renal colic: Secondary | ICD-10-CM | POA: Diagnosis not present

## 2017-02-27 DIAGNOSIS — Z91040 Latex allergy status: Secondary | ICD-10-CM | POA: Insufficient documentation

## 2017-02-27 LAB — URINALYSIS, ROUTINE W REFLEX MICROSCOPIC
BILIRUBIN URINE: NEGATIVE
GLUCOSE, UA: NEGATIVE mg/dL
KETONES UR: NEGATIVE mg/dL
Nitrite: POSITIVE — AB
PROTEIN: 100 mg/dL — AB
Specific Gravity, Urine: 1.023 (ref 1.005–1.030)
pH: 5 (ref 5.0–8.0)

## 2017-02-27 LAB — CBC WITH DIFFERENTIAL/PLATELET
Basophils Absolute: 0 10*3/uL (ref 0.0–0.1)
Basophils Relative: 0 %
EOS ABS: 0 10*3/uL (ref 0.0–0.7)
EOS PCT: 0 %
HCT: 39 % (ref 36.0–46.0)
Hemoglobin: 12 g/dL (ref 12.0–15.0)
LYMPHS ABS: 1.2 10*3/uL (ref 0.7–4.0)
Lymphocytes Relative: 7 %
MCH: 24.2 pg — AB (ref 26.0–34.0)
MCHC: 30.8 g/dL (ref 30.0–36.0)
MCV: 78.6 fL (ref 78.0–100.0)
MONOS PCT: 8 %
Monocytes Absolute: 1.3 10*3/uL — ABNORMAL HIGH (ref 0.1–1.0)
Neutro Abs: 13.6 10*3/uL — ABNORMAL HIGH (ref 1.7–7.7)
Neutrophils Relative %: 85 %
PLATELETS: 444 10*3/uL — AB (ref 150–400)
RBC: 4.96 MIL/uL (ref 3.87–5.11)
RDW: 15.3 % (ref 11.5–15.5)
WBC: 16.1 10*3/uL — ABNORMAL HIGH (ref 4.0–10.5)

## 2017-02-27 LAB — COMPREHENSIVE METABOLIC PANEL
ALBUMIN: 3.2 g/dL — AB (ref 3.5–5.0)
ALK PHOS: 121 U/L (ref 38–126)
ALT: 10 U/L — ABNORMAL LOW (ref 14–54)
AST: 18 U/L (ref 15–41)
Anion gap: 10 (ref 5–15)
BUN: 15 mg/dL (ref 6–20)
CALCIUM: 9.1 mg/dL (ref 8.9–10.3)
CO2: 29 mmol/L (ref 22–32)
CREATININE: 1.19 mg/dL — AB (ref 0.44–1.00)
Chloride: 97 mmol/L — ABNORMAL LOW (ref 101–111)
GFR calc Af Amer: >60 mL/min (ref >60)
GFR calc non Af Amer: 58 mL/min — ABNORMAL LOW (ref >60)
GLUCOSE: 119 mg/dL — AB (ref 65–99)
Potassium: 3.3 mmol/L — ABNORMAL LOW (ref 3.5–5.1)
SODIUM: 136 mmol/L (ref 135–145)
Total Bilirubin: 0.3 mg/dL (ref 0.3–1.2)
Total Protein: 8.2 g/dL — ABNORMAL HIGH (ref 6.5–8.1)

## 2017-02-27 LAB — LIPASE, BLOOD: Lipase: 33 U/L (ref 11–51)

## 2017-02-27 LAB — PREGNANCY, URINE: PREG TEST UR: NEGATIVE

## 2017-02-27 MED ORDER — SODIUM CHLORIDE 0.9 % IV BOLUS (SEPSIS)
1000.0000 mL | Freq: Once | INTRAVENOUS | Status: AC
  Administered 2017-02-27: 1000 mL via INTRAVENOUS

## 2017-02-27 MED ORDER — ONDANSETRON 4 MG PO TBDP: 4.0000 mg | ORAL_TABLET | Freq: Three times a day (TID) | ORAL | 0 refills | Status: AC | PRN

## 2017-02-27 MED ORDER — LACTULOSE 20 GM/30ML PO SOLN: 20.0000 g | Freq: Two times a day (BID) | ORAL | 0 refills | Status: AC

## 2017-02-27 MED ORDER — ONDANSETRON HCL 4 MG/2ML IJ SOLN
4.0000 mg | Freq: Once | INTRAMUSCULAR | Status: AC
  Administered 2017-02-27: 4 mg via INTRAVENOUS
  Filled 2017-02-27: qty 2

## 2017-02-27 MED ORDER — SULFAMETHOXAZOLE-TRIMETHOPRIM 800-160 MG PO TABS: 1.0000 | ORAL_TABLET | Freq: Two times a day (BID) | ORAL | 0 refills | Status: AC

## 2017-02-27 MED ORDER — LEVOFLOXACIN IN D5W 750 MG/150ML IV SOLN
750.0000 mg | Freq: Once | INTRAVENOUS | Status: AC
Start: 2017-02-27 — End: 2017-02-27
  Administered 2017-02-27: 750 mg via INTRAVENOUS
  Filled 2017-02-27: qty 150

## 2017-02-27 MED ORDER — HYDROCODONE-ACETAMINOPHEN 5-325 MG PO TABS: 1.0000 | ORAL_TABLET | ORAL | 0 refills | Status: DC | PRN

## 2017-02-27 MED ORDER — MORPHINE SULFATE (PF) 4 MG/ML IV SOLN
4.0000 mg | Freq: Once | INTRAVENOUS | Status: AC
  Administered 2017-02-27: 4 mg via INTRAVENOUS
  Filled 2017-02-27: qty 1

## 2017-02-27 MED ORDER — SODIUM CHLORIDE 0.9 % IV SOLN
INTRAVENOUS | Status: DC
  Administered 2017-02-27: 13:00:00 via INTRAVENOUS

## 2017-02-27 MED ORDER — KETOROLAC TROMETHAMINE 30 MG/ML IJ SOLN
30.0000 mg | Freq: Once | INTRAMUSCULAR | Status: AC
  Administered 2017-02-27: 30 mg via INTRAVENOUS
  Filled 2017-02-27: qty 1

## 2017-02-27 MED ORDER — POLYETHYLENE GLYCOL 3350 17 G PO PACK
17.0000 g | PACK | Freq: Every day | ORAL | Status: DC
  Administered 2017-02-27: 17 g via ORAL
  Filled 2017-02-27: qty 1

## 2017-02-27 MED ORDER — HYDROMORPHONE HCL 1 MG/ML IJ SOLN
1.0000 mg | Freq: Once | INTRAMUSCULAR | Status: AC
  Administered 2017-02-27: 1 mg via INTRAVENOUS
  Filled 2017-02-27: qty 1

## 2017-02-27 MED ORDER — HYDROMORPHONE HCL 1 MG/ML IJ SOLN: 1.0000 mg | Freq: Once | INTRAMUSCULAR | Status: DC

## 2017-02-27 NOTE — ED Notes (Signed)
Patient transported to CT 

## 2017-02-27 NOTE — ED Notes (Signed)
Discharge instructions, follow up care, and prescriptions reviewed with patient. Patient verbalized understanding. 

## 2017-02-27 NOTE — ED Notes (Signed)
Attempted IV insertion. IV insertion unsuccessful. However, was able to obtain blood work.

## 2017-02-27 NOTE — ED Notes (Signed)
Pt made aware of urine sample. Patient states she cannot void at this time. Patient encouraged to void when able.

## 2017-02-27 NOTE — ED Notes (Signed)
Pt reminded of need for urine 

## 2017-02-27 NOTE — ED Triage Notes (Signed)
Pt complains of right sided abdominal pain and constipation x 5 days. Pt was hospitalized and had surgery for bowel blockage 4 years ago. Pt also complains of n/v, but has not vomited today. Pt went to urgent care, was sent to ED. HR was in 170s at urgent care.

## 2017-02-27 NOTE — ED Notes (Signed)
Bed: WA16 Expected date:  Expected time:  Means of arrival:  Comments: Res A 

## 2017-02-27 NOTE — ED Provider Notes (Signed)
[facility]-EMERGENCY DEPT Provider Note   CSN: [id number] Arrival date & time: 02/27/17  1046     History   Chief Complaint Chief Complaint  Patient presents with  . Abdominal Pain  . Constipation    HPI [name] is a 36 y.o. female.  Pt presents to the ED today with n/v and constipation.  Pt said that she's had right sided abdominal pain and constipation for 5 days.  Pt has also had n/v.  The pt has had a bowel obstruction in the past, and is concerned this is similar.  The pt initially went to urgent care who sent her here when they noticed that she was very tachycardic.      Past Medical History:  Diagnosis Date  . Abdominal pain   . Anemia   . Anxiety   . Blood transfusion   . Depression   . GERD (gastroesophageal reflux disease)   . Headache(784.0)   . Hypothyroidism   . Polycystic ovarian syndrome    diagnosed at very early age    Patient Active Problem List   Diagnosis Date Noted  . Nausea and vomiting in adult 01/17/2013  . 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 Complications of gastric bypass surgery 01/11/2012  . Migraine 09/05/2011  . RLS (restless legs syndrome) 09/05/2011  . Hypothyroidism 09/05/2011  . Depression 09/02/2011  . Anxiety 09/02/2011    Past Surgical History:  Procedure Laterality Date  . ADENOIDECTOMY    . APPENDECTOMY  1996  . CHOLECYSTECTOMY  1998  . ESOPHAGOGASTRODUODENOSCOPY  09/03/2011   Procedure: ESOPHAGOGASTRODUODENOSCOPY (EGD);  Surgeon: Kandis Cocking, MD;  Location: [facility];  Service: General;  Laterality: N/A;  . ESOPHAGOGASTRODUODENOSCOPY N/A 01/17/2013   Procedure: ESOPHAGOGASTRODUODENOSCOPY (EGD);  Surgeon: Beverley Fiedler, MD;  Location: [facility];  Service: Gastroenterology;  Laterality: N/A;  Pat  . GASTROJEJUNOSTOMY  09/06/2011   Procedure: LAPAROSCOPIC GASTROJEJUNOSTOMY;  Surgeon: Mariella Saa, MD;  Location: [facility];  Service: General;  Laterality: N/A;  . TONSILLECTOMY  1983    OB  History    No data available       Home Medications    Prior to Admission medications   Medication Sig Start Date End Date Taking? Authorizing Provider  ARIPiprazole (ABILIFY) 5 MG tablet Take 5 mg by mouth at bedtime. 02/07/17  Yes Historical Provider, MD  levothyroxine (SYNTHROID) 125 MCG tablet Take 125 mcg by mouth daily before breakfast.   Yes Historical Provider, MD  Multiple Vitamins-Minerals (MULTIVITAMINS THER. W/MINERALS) TABS Take 1 tablet by mouth daily.     Yes Historical Provider, MD  naproxen sodium (ANAPROX) 220 MG tablet Take 220 mg by mouth 2 (two) times daily as needed (pain).   Yes Historical Provider, MD  QUEtiapine (SEROQUEL XR) 200 MG 24 hr tablet Take 200 mg by mouth at bedtime. 02/15/17  Yes Historical Provider, MD  sertraline (ZOLOFT) 100 MG tablet Take 100 mg by mouth every morning. 02/07/17  Yes Historical Provider, MD  traZODone (DESYREL) 100 MG tablet Take 100 mg by mouth at bedtime. 01/30/17  Yes Historical Provider, MD  Vitamin D, Ergocalciferol, (DRISDOL) 50000 units CAPS capsule Take 100,000 Units by mouth every 7 (seven) days.   Yes Historical Provider, MD  chlorpheniramine-HYDROcodone (TUSSIONEX PENNKINETIC ER) 10-8 MG/5ML SUER Take 5 mLs by mouth 2 (two) times daily. Patient not taking: Reported on 02/27/2017 01/16/16   Tharon Aquas, PA  dasatinib (SPRYCEL) 140 MG tablet Take 140 mg by mouth daily.    Historical  Provider, MD  diclofenac (VOLTAREN) 75 MG EC tablet Take 1 tablet (75 mg total) by mouth 2 (two) times daily. Patient not taking: Reported on 02/27/2017 06/10/13   Pearline Cables, MD  HYDROcodone-acetaminophen (NORCO/VICODIN) 5-325 MG tablet Take 1 tablet by mouth every 4 (four) hours as needed. 02/27/17   Jacalyn Lefevre, MD  INTERFERON ALFA-2A [place] Inject 1 Dose into the skin daily.    Historical Provider, MD  Iron TABS Take 1 tablet by mouth daily.    Historical Provider, MD  Lactulose 20 GM/30ML SOLN Take 30 mLs (20 g total) by mouth 2 (two) times  daily. 02/27/17   Jacalyn Lefevre, MD  levothyroxine (SYNTHROID, LEVOTHROID) 125 MCG tablet Take 125 mcg by mouth daily.    Historical Provider, MD  ondansetron (ZOFRAN ODT) 4 MG disintegrating tablet Take 1 tablet (4 mg total) by mouth every 8 (eight) hours as needed for nausea or vomiting. 02/27/17   Jacalyn Lefevre, MD  predniSONE (STERAPRED UNI-PAK 21 TAB) 10 MG (21) TBPK tablet Take 1 tablet (10 mg total) by mouth daily. Take 6 tablets today then decrease by 1 tablet each day till finished (6,5,4,3,2,1) Patient not taking: Reported on 02/27/2017 10/29/16   Dorena Bodo, NP  promethazine (PHENERGAN) 12.5 MG tablet Take 1 tablet (12.5 mg total) by mouth every 6 (six) hours as needed for nausea. Patient not taking: Reported on 02/27/2017 01/21/13   Glenna Fellows, MD  promethazine-codeine [facility] WITH CODEINE) 6.25-10 MG/5ML syrup Take 5 mLs by mouth every 6 (six) hours as needed for cough. Patient not taking: Reported on 02/27/2017 10/29/16   Dorena Bodo, NP  sulfamethoxazole-trimethoprim (BACTRIM DS,SEPTRA DS) 800-160 MG tablet Take 1 tablet by mouth 2 (two) times daily. 02/27/17 03/06/17  Jacalyn Lefevre, MD    Family History Family History  Problem Relation Age of Onset  . Cancer Mother     breast  . Other Father     cerebal vasculitis  . Cancer Maternal Grandmother     skin  . Cancer Maternal Grandfather     lung    Social History Social History  Substance Use Topics  . Smoking status: Never Smoker  . Smokeless tobacco: Never Used  . Alcohol use No     Allergies   Iodine; Penicillins; Shellfish allergy; Shellfish-derived products; Prednisone; Adhesive [tape]; Contrast media [iodinated diagnostic agents]; Latex; Pertussis vaccines; Topiramate [topamax]; Zolmitriptan; Eszopiclone; Lamictal [lamotrigine]; Metoclopramide; and Zolpidem   Review of Systems Review of Systems  Gastrointestinal: Positive for abdominal pain, constipation, nausea and vomiting.  All other systems  reviewed and are negative.    Physical Exam Updated Vital Signs BP (!) 125/92   Pulse 94   Temp 99 F (37.2 C) (Oral)   Resp 20   LMP 02/11/2017   SpO2 91%   Physical Exam  Constitutional: She is oriented to person, place, and time. She appears well-developed and well-nourished.  HENT:  Head: Normocephalic and atraumatic.  Right Ear: External ear normal.  Left Ear: External ear normal.  Nose: Nose normal.  Mouth/Throat: Mucous membranes are dry.  Eyes: Conjunctivae and EOM are normal. Pupils are equal, round, and reactive to light.  Neck: Normal range of motion. Neck supple.  Cardiovascular: Regular rhythm, normal heart sounds and intact distal pulses.  Tachycardia present.   Pulmonary/Chest: Effort normal and breath sounds normal.  Abdominal: Bowel sounds are decreased. There is generalized tenderness.  Musculoskeletal: Normal range of motion.  Neurological: She is alert and oriented to person, place, and time.  Skin: Skin  is warm.  Psychiatric: She has a normal mood and affect. Her behavior is normal. Judgment and thought content normal.  Nursing note and vitals reviewed.    ED Treatments / Results  Labs (all labs ordered are listed, but only abnormal results are displayed) Labs Reviewed  COMPREHENSIVE METABOLIC PANEL - Abnormal; Notable for the following:       Result Value   Potassium 3.3 (*)    Chloride 97 (*)    Glucose, Bld 119 (*)    Creatinine, Ser 1.19 (*)    Total Protein 8.2 (*)    Albumin 3.2 (*)    ALT 10 (*)    GFR calc non Af Amer 58 (*)    All other components within normal limits  CBC WITH DIFFERENTIAL/PLATELET - Abnormal; Notable for the following:    WBC 16.1 (*)    MCH 24.2 (*)    Platelets 444 (*)    Neutro Abs 13.6 (*)    Monocytes Absolute 1.3 (*)    All other components within normal limits  URINALYSIS, ROUTINE W REFLEX MICROSCOPIC - Abnormal; Notable for the following:    Color, Urine AMBER (*)    APPearance CLOUDY (*)    Hgb urine  dipstick SMALL (*)    Protein, ur 100 (*)    Nitrite POSITIVE (*)    Leukocytes, UA MODERATE (*)    Bacteria, UA MANY (*)    Squamous Epithelial / LPF 6-30 (*)    All other components within normal limits  URINE CULTURE  LIPASE, BLOOD  PREGNANCY, URINE    EKG  EKG Interpretation  Date/Time:  Wednesday Feb 27 2017 11:09:19 EDT Ventricular Rate:  158 PR Interval:    QRS Duration: 86 QT Interval:  261 QTC Calculation: 424 R Axis:   73 Text Interpretation:  Sinus tachycardia Borderline T abnormalities, inferior leads No significant change since last tracing Confirmed by [facility] MD, Niah Heinle ([id number]) on 02/27/2017 11:15:13 AM       Radiology Ct Abdomen Pelvis Wo Contrast  Result Date: 02/27/2017 CLINICAL DATA:  Right-sided pain EXAM: CT ABDOMEN AND PELVIS WITHOUT CONTRAST TECHNIQUE: Multidetector CT imaging of the abdomen and pelvis was performed following the standard protocol without IV contrast. COMPARISON:  CT abdomen pelvis 01/19/2013 FINDINGS: Lower chest: Lung bases clear. Hepatobiliary: Cholecystectomy.  Normal liver and bile ducts. Pancreas: Negative Spleen: Negative Adrenals/Urinary Tract: Mild stranding in the perirenal fat on the right which was not present previously. 2 mm stone in the right renal pelvis may be causing intermittent obstruction. Currently, no hydronephrosis and no ureteral calculus. Multiple small left renal calculi have developed since the prior CT. No obstruction of the left kidney. Stomach/Bowel: Prior gastric surgery. Negative for bowel obstruction. No bowel mass or edema. Prominent stool in the rectosigmoid colon Vascular/Lymphatic: Negative Reproductive: Normal uterus. Lobular contour to the uterus compatible with fibroids. The uterus is overall not enlarged. Other: No free fluid. Musculoskeletal: Negative IMPRESSION: Perinephric stranding around the right kidney suggesting recent obstruction. 2 mm stone in the right renal pelvis without obstruction at this time.  Interval development of multiple small left renal calculi without obstruction Fibroid uterus. Electronically Signed   By: Marlan Palau M.D.   On: 02/27/2017 12:04    Procedures Procedures (including critical care time)  Medications Ordered in ED Medications  sodium chloride 0.9 % bolus 1,000 mL (0 mLs Intravenous Stopped 02/27/17 1233)    And  0.9 %  sodium chloride infusion ( Intravenous New Bag/Given 02/27/17 1315)  polyethylene glycol (  MIRALAX / GLYCOLAX) packet 17 g (17 g Oral Given 02/27/17 1319)  levofloxacin (LEVAQUIN) IVPB 750 mg (750 mg Intravenous New Bag/Given 02/27/17 1518)  sodium chloride 0.9 % bolus 1,000 mL (0 mLs Intravenous Stopped 02/27/17 1347)  ondansetron (ZOFRAN) injection 4 mg (4 mg Intravenous Given 02/27/17 1134)  morphine 4 MG/ML injection 4 mg (4 mg Intravenous Given 02/27/17 1134)  HYDROmorphone (DILAUDID) injection 1 mg (1 mg Intravenous Given 02/27/17 1317)  sodium chloride 0.9 % bolus 1,000 mL (0 mLs Intravenous Stopped 02/27/17 1514)  sodium chloride 0.9 % bolus 1,000 mL (1,000 mLs Intravenous New Bag/Given 02/27/17 1521)  ketorolac (TORADOL) 30 MG/ML injection 30 mg (30 mg Intravenous Given 02/27/17 1515)     Initial Impression / Assessment and Plan / ED Course  I have reviewed the triage vital signs and the nursing notes.  Pertinent labs & imaging results that were available during my care of the patient were reviewed by me and considered in my medical decision making (see chart for details).     Pt is feeling much better.  Her HR is normal.  She is given her first dose of abx for UTI and IVFs.  No renal stone obstruction currently.  Pt did not want an enema here.  She has been on miralax, so I will give her lactulose.  Pt knows to return if worse.  Final Clinical Impressions(s) / ED Diagnoses   Final diagnoses:  Constipation, unspecified constipation type  Acute cystitis without hematuria  Renal colic on right side  Dehydration    New Prescriptions New  Prescriptions   HYDROCODONE-ACETAMINOPHEN (NORCO/VICODIN) 5-325 MG TABLET    Take 1 tablet by mouth every 4 (four) hours as needed.   LACTULOSE 20 GM/30ML SOLN    Take 30 mLs (20 g total) by mouth 2 (two) times daily.   ONDANSETRON (ZOFRAN ODT) 4 MG DISINTEGRATING TABLET    Take 1 tablet (4 mg total) by mouth every 8 (eight) hours as needed for nausea or vomiting.   SULFAMETHOXAZOLE-TRIMETHOPRIM (BACTRIM DS,SEPTRA DS) 800-160 MG TABLET    Take 1 tablet by mouth 2 (two) times daily.     Jacalyn Lefevre, MD 02/27/17 [phone]

## 2017-02-27 NOTE — ED Triage Notes (Signed)
The patient presented to the [facility] with a complaint of abdominal pain with N/V x 4 days. The The patient also reported previously being  Constipated and has had 1 bowel movement. The patient reported a hx of gastric bypass.

## 2017-03-01 LAB — URINE CULTURE: Culture: >=100000 — AB

## 2017-03-02 ENCOUNTER — Telehealth: Payer: Self-pay

## 2017-03-02 NOTE — Telephone Encounter (Signed)
Post ED Visit - Positive Culture Follow-up  Culture report reviewed by antimicrobial stewardship pharmacist:  []  Enzo BiNathan Batchelder, Pharm.D. []  Celedonio MiyamotoJeremy Frens, Pharm.D., BCPS AQ-ID []  Garvin FilaMike Maccia, Pharm.D., BCPS []  Georgina PillionElizabeth Martin, [address]Pharm[address]D., BCPS []  LoloMinh Pham, Vermont[place]arm.D., BCPS, AAHIVP []  Estella HuskMichelle Turner, Pharm.D., BCPS, AAHIVP []  Lysle Pearlachel Rumbarger, PharmD, BCPS []  Casilda Carlsaylor Stone, PharmD, BCPS []  Pollyann SamplesAndy Johnston, PharmD, BCPS Harold HedgeAllison Pharm D Positive urine culture Treated with Sulfamethoxazole-Trimethoprim, organism sensitive to the same and no further patient follow-up is required at this time.  Jerry CarasCullom, Edison Wollschlager Burnett 03/02/2017, 9:57 AM

## 2017-03-11 ENCOUNTER — Encounter (HOSPITAL_COMMUNITY): Payer: Self-pay | Admitting: Emergency Medicine

## 2017-03-11 ENCOUNTER — Emergency Department (HOSPITAL_COMMUNITY)
Admission: EM | Admit: 2017-03-11 | Discharge: 2017-03-12 | Disposition: A | Discharge status: Home / Self Care | Payer: Managed Care, Other (non HMO) | Attending: Emergency Medicine | Admitting: Emergency Medicine

## 2017-03-11 DIAGNOSIS — Y939 Activity, unspecified: Secondary | ICD-10-CM | POA: Diagnosis not present

## 2017-03-11 DIAGNOSIS — W19XXXA Unspecified fall, initial encounter: Secondary | ICD-10-CM | POA: Insufficient documentation

## 2017-03-11 DIAGNOSIS — Y999 Unspecified external cause status: Secondary | ICD-10-CM | POA: Insufficient documentation

## 2017-03-11 DIAGNOSIS — Y929 Unspecified place or not applicable: Secondary | ICD-10-CM | POA: Diagnosis not present

## 2017-03-11 DIAGNOSIS — S0990XA Unspecified injury of head, initial encounter: Secondary | ICD-10-CM | POA: Diagnosis not present

## 2017-03-11 DIAGNOSIS — Z5321 Procedure and treatment not carried out due to patient leaving prior to being seen by health care provider: Secondary | ICD-10-CM | POA: Diagnosis not present

## 2017-03-11 DIAGNOSIS — T404X4A Poisoning by other synthetic narcotics, undetermined, initial encounter: Secondary | ICD-10-CM | POA: Insufficient documentation

## 2017-03-11 DIAGNOSIS — R4182 Altered mental status, unspecified: Secondary | ICD-10-CM | POA: Insufficient documentation

## 2017-03-11 LAB — CBC
HEMATOCRIT: 34 % — AB (ref 36.0–46.0)
Hemoglobin: 9.9 g/dL — ABNORMAL LOW (ref 12.0–15.0)
MCH: 23.2 pg — ABNORMAL LOW (ref 26.0–34.0)
MCHC: 29.1 g/dL — ABNORMAL LOW (ref 30.0–36.0)
MCV: 79.8 fL (ref 78.0–100.0)
PLATELETS: 573 10*3/uL — AB (ref 150–400)
RBC: 4.26 MIL/uL (ref 3.87–5.11)
RDW: 16.7 % — ABNORMAL HIGH (ref 11.5–15.5)
WBC: 10.3 10*3/uL (ref 4.0–10.5)

## 2017-03-11 LAB — COMPREHENSIVE METABOLIC PANEL
ALT: 8 U/L — ABNORMAL LOW (ref 14–54)
AST: 13 U/L — ABNORMAL LOW (ref 15–41)
Albumin: 3.1 g/dL — ABNORMAL LOW (ref 3.5–5.0)
Alkaline Phosphatase: 86 U/L (ref 38–126)
Anion gap: 7 (ref 5–15)
BUN: 7 mg/dL (ref 6–20)
CHLORIDE: 103 mmol/L (ref 101–111)
CO2: 27 mmol/L (ref 22–32)
Calcium: 8.6 mg/dL — ABNORMAL LOW (ref 8.9–10.3)
Creatinine, Ser: 1.13 mg/dL — ABNORMAL HIGH (ref 0.44–1.00)
GFR calc Af Amer: >60 mL/min (ref >60)
Glucose, Bld: 99 mg/dL (ref 65–99)
Potassium: 4 mmol/L (ref 3.5–5.1)
Sodium: 137 mmol/L (ref 135–145)
Total Bilirubin: 0.2 mg/dL — ABNORMAL LOW (ref 0.3–1.2)
Total Protein: 6.3 g/dL — ABNORMAL LOW (ref 6.5–8.1)

## 2017-03-11 LAB — CBG MONITORING, ED: Glucose-Capillary: 96 mg/dL (ref 65–99)

## 2017-03-11 NOTE — ED Triage Notes (Signed)
Pt arrives with her mother who states that since pt has been taking nucynta for pain management of her fibromyalgia she has not been acting herself. pts mother reports pt has had two falls today, one hitting her head on the hardwood floor, also states pt has been having conversations with people who are not there. Pt denies hallucination, a/ox4, resp e/u. pts mother is concerned pt may be taking more pills than she is prescribed. Pt reports taking two pills today.

## 2017-03-11 NOTE — ED Notes (Signed)
Patient did not answer when called for room x 2.  

## 2017-06-03 ENCOUNTER — Ambulatory Visit (HOSPITAL_COMMUNITY)
Admission: EM | Admit: 2017-06-03 | Discharge: 2017-06-03 | Disposition: A | Discharge status: Home / Self Care | Payer: Managed Care, Other (non HMO) | Attending: Internal Medicine | Admitting: Internal Medicine

## 2017-06-03 ENCOUNTER — Encounter (HOSPITAL_COMMUNITY): Payer: Self-pay | Admitting: Emergency Medicine

## 2017-06-03 DIAGNOSIS — N3001 Acute cystitis with hematuria: Secondary | ICD-10-CM

## 2017-06-03 DIAGNOSIS — H6121 Impacted cerumen, right ear: Secondary | ICD-10-CM | POA: Diagnosis not present

## 2017-06-03 HISTORY — DX: Fibromyalgia: M79.7

## 2017-06-03 LAB — POCT URINALYSIS DIP (DEVICE)
BILIRUBIN URINE: NEGATIVE
Glucose, UA: NEGATIVE mg/dL
NITRITE: POSITIVE — AB
PH: 5.5 (ref 5.0–8.0)
Protein, ur: 100 mg/dL — AB
Specific Gravity, Urine: 1.025 (ref 1.005–1.030)
Urobilinogen, UA: 4 mg/dL — ABNORMAL HIGH (ref 0.0–1.0)

## 2017-06-03 LAB — POCT RAPID STREP A: STREPTOCOCCUS, GROUP A SCREEN (DIRECT): NEGATIVE

## 2017-06-03 MED ORDER — CEPHALEXIN 500 MG PO CAPS: 500.0000 mg | ORAL_CAPSULE | Freq: Four times a day (QID) | ORAL | 0 refills | Status: AC

## 2017-06-03 MED ORDER — IBUPROFEN 800 MG PO TABS
800.0000 mg | ORAL_TABLET | Freq: Once | ORAL | Status: AC
  Administered 2017-06-03: 800 mg via ORAL

## 2017-06-03 MED ORDER — PHENAZOPYRIDINE HCL 200 MG PO TABS: 200.0000 mg | ORAL_TABLET | Freq: Three times a day (TID) | ORAL | 0 refills | Status: AC | PRN

## 2017-06-03 MED ORDER — IBUPROFEN 800 MG PO TABS
ORAL_TABLET | ORAL | Status: AC
  Filled 2017-06-03: qty 1

## 2017-06-03 NOTE — ED Provider Notes (Signed)
[facility]   [id number] 06/03/17 Arrival Time: 1340  ASSESSMENT & PLAN:  1. Acute cystitis with hematuria   2. Impacted cerumen of right ear     Meds ordered this encounter  Medications  . MORPHINE SULFATE, CONCENTRATE, PO    Sig: Take by mouth.  Marland Kitchen acetaminophen (TYLENOL) 325 MG tablet    Sig: Take 650 mg by mouth every 6 (six) hours as needed.  Marland Kitchen ibuprofen (ADVIL,MOTRIN) tablet 800 mg  . cephALEXin (KEFLEX) 500 MG capsule    Sig: Take 1 capsule (500 mg total) by mouth 4 (four) times daily.    Dispense:  20 capsule    Refill:  0    Order Specific Question:   Supervising Provider    Answer:   Eustace Moore [[id number]]  . phenazopyridine (PYRIDIUM) 200 MG tablet    Sig: Take 1 tablet (200 mg total) by mouth 3 (three) times daily as needed for pain.    Dispense:  10 tablet    Refill:  0    Order Specific Question:   Supervising Provider    Answer:   Eustace Moore [[id number]]   Ear cleared in clinic, TM intact without evidence of Trauma Reviewed expectations re: course of current medical issues. Questions answered. Outlined signs and symptoms indicating need for more acute intervention. Patient verbalized understanding. After Visit Summary given.   SUBJECTIVE:  [name] is a 36 y.o. female who presents with complaint of Fever for 3 days. States that her fever has been as high as 104 at home. She has been taking Tylenol with minimal relief for the fever, her last dose was approximately 3 hours ago. She has no congestion, ear pain or pressure, no cough, or sore throat, is nauseated, but no vomiting or diarrhea, she does have a headache, no light sensitivity, and she has full range of motion around the neck. She has no dysuria, frequency, urgency, no dysmenorrhea, vaginal itching or discharge, or other complaints. 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 Weeks, has not been outside of the [place] 3 years. Past  history is significant for fibromyalgia, she takes morphine for pain.  ROS: As per HPI, remainder of ROS negative.   OBJECTIVE:  Vitals:   06/03/17 1450  BP: (!) 120/92  Pulse: (!) 122  Resp: 20  Temp: (!) 102.1 F (38.9 C)  TempSrc: Oral  SpO2: 98%     General appearance: alert; no distress HEENT: normocephalic; atraumatic; conjunctivae normal; Right side cerumen impaction, left tympanic membrane normal without erythema, bulging, or evidence of purulence, oropharynx clear without erythema or edema, tonsils +1 without exudate Neck: No cervical lymphadenopathy Lungs: clear to auscultation bilaterally Heart: regular rate and rhythm Abdomen: soft, non-tender; bowel sounds normal; no masses or organomegaly; no guarding or rebound tenderness Back: no CVA tenderness Extremities: no cyanosis or edema; symmetrical with no gross deformities Skin: warm and dry Neurologic: Grossly normal Psychological:  alert and cooperative; normal mood and affect  Procedures:     Results for orders placed or performed during the hospital encounter of 06/03/17  POCT urinalysis dip (device)  Result Value Ref Range   Glucose, UA NEGATIVE NEGATIVE mg/dL   Bilirubin Urine NEGATIVE NEGATIVE   Ketones, ur TRACE (A) NEGATIVE mg/dL   Specific Gravity, Urine 1.025 1.005 - 1.030   Hgb urine dipstick TRACE (A) NEGATIVE   pH 5.5 5.0 - 8.0   Protein, ur 100 (A) NEGATIVE mg/dL  Urobilinogen, UA 4.0 (H) 0.0 - 1.0 mg/dL   Nitrite POSITIVE (A) NEGATIVE   Leukocytes, UA TRACE (A) NEGATIVE  POCT rapid strep A [facility]  Result Value Ref Range   Streptococcus, Group A Screen (Direct) NEGATIVE NEGATIVE    Labs Reviewed  POCT URINALYSIS DIP (DEVICE) - Abnormal; Notable for the following:       Result Value   Ketones, ur TRACE (*)    Hgb urine dipstick TRACE (*)    Protein, ur 100 (*)    Urobilinogen, UA 4.0 (*)    Nitrite POSITIVE (*)    Leukocytes, UA TRACE (*)    All other components within  normal limits  POCT RAPID STREP A    No results found.  Allergies  Allergen Reactions  . Iodine Anaphylaxis    Pt stated on 01-19-13-----difficulty breathing with pre-meds on board in past  . Penicillins Anaphylaxis, Hives and Rash    Has patient had a PCN reaction causing immediate rash, facial/tongue/throat swelling, SOB or lightheadedness with hypotension: Yes Has patient had a PCN reaction causing severe rash involving mucus membranes or skin necrosis: Yes Has patient had a PCN reaction that required hospitalization No Has patient had a PCN reaction occurring within the last 10 years: Yes If all of the above answers are "NO", then may proceed with Cephalosporin use.   . Shellfish Allergy Anaphylaxis  . Shellfish-Derived Products Anaphylaxis  . Prednisone Anxiety  . Adhesive [Tape] Other (See Comments)    Unknown reaction  . Contrast Media [Iodinated Diagnostic Agents] Nausea And Vomiting and Other (See Comments)    headache  . Latex Other (See Comments)    Plastic tape (unknown reaction)  . Pertussis Vaccines Other (See Comments)    Seizures   . Topiramate [Topamax] Other (See Comments)    tremors  . Zolmitriptan Other (See Comments)  . Eszopiclone Anxiety    Stays extremely awake  . Lamictal [Lamotrigine] Rash  . Metoclopramide Anxiety  . Zolpidem Anxiety    Stay extremely awake    PMHx, SurgHx, SocialHx, Medications, and Allergies were reviewed in the Visit Navigator and updated as appropriate.       Dorena BodoKennard, Mette Southgate, NP 06/03/17 1538

## 2017-06-03 NOTE — ED Triage Notes (Signed)
Onset Saturday of fever-states fever was 104.6, but had to work.  States the lowest reading has been 102.  Patient has a headache and chills

## 2017-06-03 NOTE — Discharge Instructions (Signed)
You are being treated today for a urinary tract infection. I have prescribed Keflex, take 1 tablet 4 times a day for 5 days. I have also prescribed Pyridium. Take 1 tablet a day 3 times a day for 2 days. Your urine will be sent for culture and you will be notified should any change in therapy be needed. Drink plenty of fluids and rest. Should your symptoms fail to resolve, follow up with your primary care provider or return to clinic.  °

## 2017-06-06 LAB — URINE CULTURE: Culture: >=100000 — AB

## 2018-08-06 ENCOUNTER — Telehealth: Payer: Self-pay

## 2018-08-06 NOTE — Telephone Encounter (Signed)
Referral notes received from [facility]. Referring Provider: Dr. Birdena Jubilee Phone: [phone].  Notes sent to scheduling

## 2018-08-18 ENCOUNTER — Encounter: Payer: Self-pay | Admitting: Internal Medicine

## 2018-09-15 ENCOUNTER — Institutional Professional Consult (permissible substitution): Payer: Managed Care, Other (non HMO) | Admitting: Internal Medicine

## 2018-09-17 ENCOUNTER — Encounter: Payer: Self-pay | Admitting: Internal Medicine
